# Patient Record
Sex: Female | Born: 1940 | Race: White | Hispanic: No | Smoking: Never smoker
Health system: Southern US, Community
[De-identification: ages and names within clinical notes are randomized; demographics above are authoritative.]

## PROBLEM LIST (undated history)

## (undated) DIAGNOSIS — M1990 Unspecified osteoarthritis, unspecified site: Secondary | ICD-10-CM

## (undated) DIAGNOSIS — F329 Major depressive disorder, single episode, unspecified: Secondary | ICD-10-CM

## (undated) DIAGNOSIS — K219 Gastro-esophageal reflux disease without esophagitis: Secondary | ICD-10-CM

## (undated) DIAGNOSIS — E785 Hyperlipidemia, unspecified: Secondary | ICD-10-CM

## (undated) DIAGNOSIS — R9431 Abnormal electrocardiogram [ECG] [EKG]: Secondary | ICD-10-CM

## (undated) DIAGNOSIS — F419 Anxiety disorder, unspecified: Secondary | ICD-10-CM

## (undated) DIAGNOSIS — I471 Supraventricular tachycardia: Secondary | ICD-10-CM

## (undated) DIAGNOSIS — E119 Type 2 diabetes mellitus without complications: Secondary | ICD-10-CM

## (undated) DIAGNOSIS — D649 Anemia, unspecified: Secondary | ICD-10-CM

## (undated) DIAGNOSIS — R55 Syncope and collapse: Secondary | ICD-10-CM

## (undated) DIAGNOSIS — I10 Essential (primary) hypertension: Secondary | ICD-10-CM

## (undated) HISTORY — PX: ABDOMINAL HYSTERECTOMY: SHX81

## (undated) HISTORY — PX: COLONOSCOPY: SHX174

## (undated) HISTORY — DX: Essential (primary) hypertension: I10

## (undated) HISTORY — DX: Anxiety disorder, unspecified: F41.9

## (undated) HISTORY — DX: Gastro-esophageal reflux disease without esophagitis: K21.9

## (undated) HISTORY — PX: CORNEAL TRANSPLANT: SHX108

## (undated) HISTORY — PX: CHOLECYSTECTOMY: SHX55

## (undated) HISTORY — DX: Unspecified osteoarthritis, unspecified site: M19.90

## (undated) HISTORY — DX: Syncope and collapse: R55

## (undated) HISTORY — DX: Anemia, unspecified: D64.9

## (undated) HISTORY — DX: Major depressive disorder, single episode, unspecified: F32.9

## (undated) HISTORY — DX: Hyperlipidemia, unspecified: E78.5

## (undated) HISTORY — PX: OTHER SURGICAL HISTORY: SHX169

## (undated) HISTORY — DX: Abnormal electrocardiogram (ECG) (EKG): R94.31

## (undated) HISTORY — DX: Supraventricular tachycardia: I47.1

## (undated) HISTORY — DX: Type 2 diabetes mellitus without complications: E11.9

---

## 2011-10-29 DIAGNOSIS — E785 Hyperlipidemia, unspecified: Secondary | ICD-10-CM | POA: Diagnosis not present

## 2011-10-29 DIAGNOSIS — Z79899 Other long term (current) drug therapy: Secondary | ICD-10-CM | POA: Diagnosis not present

## 2011-10-29 DIAGNOSIS — I10 Essential (primary) hypertension: Secondary | ICD-10-CM | POA: Diagnosis not present

## 2011-10-29 DIAGNOSIS — R7309 Other abnormal glucose: Secondary | ICD-10-CM | POA: Diagnosis not present

## 2011-10-29 DIAGNOSIS — Z6830 Body mass index (BMI) 30.0-30.9, adult: Secondary | ICD-10-CM | POA: Diagnosis not present

## 2011-10-29 DIAGNOSIS — M810 Age-related osteoporosis without current pathological fracture: Secondary | ICD-10-CM | POA: Diagnosis not present

## 2012-02-11 DIAGNOSIS — R7309 Other abnormal glucose: Secondary | ICD-10-CM | POA: Diagnosis not present

## 2012-02-11 DIAGNOSIS — Z1212 Encounter for screening for malignant neoplasm of rectum: Secondary | ICD-10-CM | POA: Diagnosis not present

## 2012-02-11 DIAGNOSIS — M810 Age-related osteoporosis without current pathological fracture: Secondary | ICD-10-CM | POA: Diagnosis not present

## 2012-02-11 DIAGNOSIS — Z6829 Body mass index (BMI) 29.0-29.9, adult: Secondary | ICD-10-CM | POA: Diagnosis not present

## 2012-02-11 DIAGNOSIS — E785 Hyperlipidemia, unspecified: Secondary | ICD-10-CM | POA: Diagnosis not present

## 2012-02-11 DIAGNOSIS — Z0000 Encounter for general adult medical examination without abnormal findings: Secondary | ICD-10-CM | POA: Diagnosis not present

## 2012-02-11 DIAGNOSIS — I10 Essential (primary) hypertension: Secondary | ICD-10-CM | POA: Diagnosis not present

## 2012-05-19 DIAGNOSIS — Z23 Encounter for immunization: Secondary | ICD-10-CM | POA: Diagnosis not present

## 2012-05-19 DIAGNOSIS — R7309 Other abnormal glucose: Secondary | ICD-10-CM | POA: Diagnosis not present

## 2012-05-19 DIAGNOSIS — E785 Hyperlipidemia, unspecified: Secondary | ICD-10-CM | POA: Diagnosis not present

## 2012-05-19 DIAGNOSIS — M810 Age-related osteoporosis without current pathological fracture: Secondary | ICD-10-CM | POA: Diagnosis not present

## 2012-05-19 DIAGNOSIS — I10 Essential (primary) hypertension: Secondary | ICD-10-CM | POA: Diagnosis not present

## 2012-05-19 DIAGNOSIS — K219 Gastro-esophageal reflux disease without esophagitis: Secondary | ICD-10-CM | POA: Diagnosis not present

## 2012-12-01 DIAGNOSIS — E785 Hyperlipidemia, unspecified: Secondary | ICD-10-CM | POA: Diagnosis not present

## 2012-12-01 DIAGNOSIS — I10 Essential (primary) hypertension: Secondary | ICD-10-CM | POA: Diagnosis not present

## 2012-12-01 DIAGNOSIS — Z1331 Encounter for screening for depression: Secondary | ICD-10-CM | POA: Diagnosis not present

## 2012-12-01 DIAGNOSIS — Z9181 History of falling: Secondary | ICD-10-CM | POA: Diagnosis not present

## 2012-12-01 DIAGNOSIS — R7309 Other abnormal glucose: Secondary | ICD-10-CM | POA: Diagnosis not present

## 2012-12-01 DIAGNOSIS — Z6828 Body mass index (BMI) 28.0-28.9, adult: Secondary | ICD-10-CM | POA: Diagnosis not present

## 2013-03-09 DIAGNOSIS — E785 Hyperlipidemia, unspecified: Secondary | ICD-10-CM | POA: Diagnosis not present

## 2013-03-09 DIAGNOSIS — Z6828 Body mass index (BMI) 28.0-28.9, adult: Secondary | ICD-10-CM | POA: Diagnosis not present

## 2013-03-09 DIAGNOSIS — Z0000 Encounter for general adult medical examination without abnormal findings: Secondary | ICD-10-CM | POA: Diagnosis not present

## 2013-03-09 DIAGNOSIS — Z79899 Other long term (current) drug therapy: Secondary | ICD-10-CM | POA: Diagnosis not present

## 2013-03-09 DIAGNOSIS — Z1212 Encounter for screening for malignant neoplasm of rectum: Secondary | ICD-10-CM | POA: Diagnosis not present

## 2013-03-09 DIAGNOSIS — E559 Vitamin D deficiency, unspecified: Secondary | ICD-10-CM | POA: Diagnosis not present

## 2013-03-09 DIAGNOSIS — R7309 Other abnormal glucose: Secondary | ICD-10-CM | POA: Diagnosis not present

## 2013-03-09 DIAGNOSIS — I10 Essential (primary) hypertension: Secondary | ICD-10-CM | POA: Diagnosis not present

## 2013-04-13 DIAGNOSIS — Z23 Encounter for immunization: Secondary | ICD-10-CM | POA: Diagnosis not present

## 2013-05-13 DIAGNOSIS — I10 Essential (primary) hypertension: Secondary | ICD-10-CM | POA: Diagnosis not present

## 2013-05-13 DIAGNOSIS — Z6828 Body mass index (BMI) 28.0-28.9, adult: Secondary | ICD-10-CM | POA: Diagnosis not present

## 2013-05-30 DIAGNOSIS — I10 Essential (primary) hypertension: Secondary | ICD-10-CM | POA: Diagnosis not present

## 2013-06-03 DIAGNOSIS — I10 Essential (primary) hypertension: Secondary | ICD-10-CM | POA: Diagnosis not present

## 2013-06-03 DIAGNOSIS — Z6827 Body mass index (BMI) 27.0-27.9, adult: Secondary | ICD-10-CM | POA: Diagnosis not present

## 2013-06-07 DIAGNOSIS — I10 Essential (primary) hypertension: Secondary | ICD-10-CM | POA: Diagnosis not present

## 2013-06-10 DIAGNOSIS — I10 Essential (primary) hypertension: Secondary | ICD-10-CM | POA: Diagnosis not present

## 2013-06-10 DIAGNOSIS — Z6827 Body mass index (BMI) 27.0-27.9, adult: Secondary | ICD-10-CM | POA: Diagnosis not present

## 2013-06-17 ENCOUNTER — Other Ambulatory Visit: Payer: Self-pay | Admitting: Internal Medicine

## 2013-06-17 DIAGNOSIS — I10 Essential (primary) hypertension: Secondary | ICD-10-CM | POA: Diagnosis not present

## 2013-06-17 DIAGNOSIS — Z6827 Body mass index (BMI) 27.0-27.9, adult: Secondary | ICD-10-CM | POA: Diagnosis not present

## 2013-06-21 ENCOUNTER — Ambulatory Visit
Admission: RE | Admit: 2013-06-21 | Discharge: 2013-06-21 | Disposition: A | Discharge status: Home / Self Care | Payer: BC Managed Care – PPO | Source: Ambulatory Visit | Attending: Internal Medicine | Admitting: Internal Medicine

## 2013-06-21 DIAGNOSIS — I10 Essential (primary) hypertension: Secondary | ICD-10-CM

## 2013-06-24 DIAGNOSIS — R9389 Abnormal findings on diagnostic imaging of other specified body structures: Secondary | ICD-10-CM | POA: Diagnosis not present

## 2013-06-24 DIAGNOSIS — I701 Atherosclerosis of renal artery: Secondary | ICD-10-CM | POA: Diagnosis not present

## 2013-06-24 DIAGNOSIS — I10 Essential (primary) hypertension: Secondary | ICD-10-CM | POA: Diagnosis not present

## 2013-06-29 DIAGNOSIS — R9389 Abnormal findings on diagnostic imaging of other specified body structures: Secondary | ICD-10-CM | POA: Diagnosis not present

## 2013-06-29 DIAGNOSIS — I701 Atherosclerosis of renal artery: Secondary | ICD-10-CM | POA: Diagnosis not present

## 2013-06-29 DIAGNOSIS — K219 Gastro-esophageal reflux disease without esophagitis: Secondary | ICD-10-CM | POA: Diagnosis not present

## 2013-06-29 DIAGNOSIS — E7800 Pure hypercholesterolemia, unspecified: Secondary | ICD-10-CM | POA: Diagnosis not present

## 2013-06-29 DIAGNOSIS — G4730 Sleep apnea, unspecified: Secondary | ICD-10-CM | POA: Diagnosis not present

## 2013-06-29 DIAGNOSIS — I10 Essential (primary) hypertension: Secondary | ICD-10-CM | POA: Diagnosis not present

## 2013-07-08 DIAGNOSIS — I119 Hypertensive heart disease without heart failure: Secondary | ICD-10-CM | POA: Diagnosis not present

## 2013-07-11 DIAGNOSIS — I10 Essential (primary) hypertension: Secondary | ICD-10-CM | POA: Diagnosis not present

## 2013-07-18 DIAGNOSIS — Z6827 Body mass index (BMI) 27.0-27.9, adult: Secondary | ICD-10-CM | POA: Diagnosis not present

## 2013-07-18 DIAGNOSIS — I10 Essential (primary) hypertension: Secondary | ICD-10-CM | POA: Diagnosis not present

## 2013-07-21 DIAGNOSIS — I10 Essential (primary) hypertension: Secondary | ICD-10-CM | POA: Diagnosis not present

## 2013-07-21 DIAGNOSIS — E7800 Pure hypercholesterolemia, unspecified: Secondary | ICD-10-CM | POA: Diagnosis not present

## 2013-07-21 DIAGNOSIS — I701 Atherosclerosis of renal artery: Secondary | ICD-10-CM | POA: Diagnosis not present

## 2013-07-26 DIAGNOSIS — G4710 Hypersomnia, unspecified: Secondary | ICD-10-CM | POA: Diagnosis not present

## 2013-07-26 DIAGNOSIS — G4730 Sleep apnea, unspecified: Secondary | ICD-10-CM | POA: Diagnosis not present

## 2013-07-28 DIAGNOSIS — I10 Essential (primary) hypertension: Secondary | ICD-10-CM | POA: Diagnosis not present

## 2013-07-28 DIAGNOSIS — I701 Atherosclerosis of renal artery: Secondary | ICD-10-CM | POA: Diagnosis not present

## 2013-07-28 DIAGNOSIS — Z6828 Body mass index (BMI) 28.0-28.9, adult: Secondary | ICD-10-CM | POA: Diagnosis not present

## 2013-08-24 DIAGNOSIS — R5381 Other malaise: Secondary | ICD-10-CM | POA: Diagnosis not present

## 2013-08-24 DIAGNOSIS — G4730 Sleep apnea, unspecified: Secondary | ICD-10-CM | POA: Diagnosis not present

## 2013-08-24 DIAGNOSIS — R5383 Other fatigue: Secondary | ICD-10-CM | POA: Diagnosis not present

## 2013-08-24 DIAGNOSIS — G4710 Hypersomnia, unspecified: Secondary | ICD-10-CM | POA: Diagnosis not present

## 2013-08-24 DIAGNOSIS — R062 Wheezing: Secondary | ICD-10-CM | POA: Diagnosis not present

## 2013-08-31 DIAGNOSIS — Z6827 Body mass index (BMI) 27.0-27.9, adult: Secondary | ICD-10-CM | POA: Diagnosis not present

## 2013-08-31 DIAGNOSIS — E785 Hyperlipidemia, unspecified: Secondary | ICD-10-CM | POA: Diagnosis not present

## 2013-08-31 DIAGNOSIS — R7309 Other abnormal glucose: Secondary | ICD-10-CM | POA: Diagnosis not present

## 2013-08-31 DIAGNOSIS — I10 Essential (primary) hypertension: Secondary | ICD-10-CM | POA: Diagnosis not present

## 2013-09-19 DIAGNOSIS — R5381 Other malaise: Secondary | ICD-10-CM | POA: Diagnosis not present

## 2013-09-19 DIAGNOSIS — G4710 Hypersomnia, unspecified: Secondary | ICD-10-CM | POA: Diagnosis not present

## 2013-09-19 DIAGNOSIS — R062 Wheezing: Secondary | ICD-10-CM | POA: Diagnosis not present

## 2013-09-19 DIAGNOSIS — G4730 Sleep apnea, unspecified: Secondary | ICD-10-CM | POA: Diagnosis not present

## 2013-10-05 DIAGNOSIS — Z6827 Body mass index (BMI) 27.0-27.9, adult: Secondary | ICD-10-CM | POA: Diagnosis not present

## 2013-10-05 DIAGNOSIS — I10 Essential (primary) hypertension: Secondary | ICD-10-CM | POA: Diagnosis not present

## 2013-10-26 DIAGNOSIS — G4710 Hypersomnia, unspecified: Secondary | ICD-10-CM | POA: Diagnosis not present

## 2013-10-26 DIAGNOSIS — R5383 Other fatigue: Secondary | ICD-10-CM | POA: Diagnosis not present

## 2013-10-26 DIAGNOSIS — G4730 Sleep apnea, unspecified: Secondary | ICD-10-CM | POA: Diagnosis not present

## 2013-10-26 DIAGNOSIS — R5381 Other malaise: Secondary | ICD-10-CM | POA: Diagnosis not present

## 2013-10-30 DIAGNOSIS — G4710 Hypersomnia, unspecified: Secondary | ICD-10-CM | POA: Diagnosis not present

## 2013-11-02 DIAGNOSIS — G4730 Sleep apnea, unspecified: Secondary | ICD-10-CM | POA: Diagnosis not present

## 2013-11-02 DIAGNOSIS — I701 Atherosclerosis of renal artery: Secondary | ICD-10-CM | POA: Diagnosis not present

## 2013-11-02 DIAGNOSIS — I10 Essential (primary) hypertension: Secondary | ICD-10-CM | POA: Diagnosis not present

## 2013-11-02 DIAGNOSIS — E7800 Pure hypercholesterolemia, unspecified: Secondary | ICD-10-CM | POA: Diagnosis not present

## 2013-11-15 DIAGNOSIS — G4730 Sleep apnea, unspecified: Secondary | ICD-10-CM | POA: Diagnosis not present

## 2013-11-15 DIAGNOSIS — I10 Essential (primary) hypertension: Secondary | ICD-10-CM | POA: Diagnosis not present

## 2013-11-15 DIAGNOSIS — IMO0002 Reserved for concepts with insufficient information to code with codable children: Secondary | ICD-10-CM | POA: Diagnosis not present

## 2013-11-15 DIAGNOSIS — G4710 Hypersomnia, unspecified: Secondary | ICD-10-CM | POA: Diagnosis not present

## 2013-11-15 DIAGNOSIS — Z6827 Body mass index (BMI) 27.0-27.9, adult: Secondary | ICD-10-CM | POA: Diagnosis not present

## 2013-11-30 DIAGNOSIS — Z6827 Body mass index (BMI) 27.0-27.9, adult: Secondary | ICD-10-CM | POA: Diagnosis not present

## 2013-11-30 DIAGNOSIS — R7309 Other abnormal glucose: Secondary | ICD-10-CM | POA: Diagnosis not present

## 2013-11-30 DIAGNOSIS — E785 Hyperlipidemia, unspecified: Secondary | ICD-10-CM | POA: Diagnosis not present

## 2013-11-30 DIAGNOSIS — I10 Essential (primary) hypertension: Secondary | ICD-10-CM | POA: Diagnosis not present

## 2013-11-30 DIAGNOSIS — IMO0002 Reserved for concepts with insufficient information to code with codable children: Secondary | ICD-10-CM | POA: Diagnosis not present

## 2013-12-01 DIAGNOSIS — IMO0001 Reserved for inherently not codable concepts without codable children: Secondary | ICD-10-CM | POA: Diagnosis not present

## 2013-12-01 DIAGNOSIS — M542 Cervicalgia: Secondary | ICD-10-CM | POA: Diagnosis not present

## 2013-12-01 DIAGNOSIS — M6281 Muscle weakness (generalized): Secondary | ICD-10-CM | POA: Diagnosis not present

## 2013-12-01 DIAGNOSIS — IMO0002 Reserved for concepts with insufficient information to code with codable children: Secondary | ICD-10-CM | POA: Diagnosis not present

## 2013-12-01 DIAGNOSIS — M25519 Pain in unspecified shoulder: Secondary | ICD-10-CM | POA: Diagnosis not present

## 2013-12-01 DIAGNOSIS — R293 Abnormal posture: Secondary | ICD-10-CM | POA: Diagnosis not present

## 2013-12-07 DIAGNOSIS — M6281 Muscle weakness (generalized): Secondary | ICD-10-CM | POA: Diagnosis not present

## 2013-12-07 DIAGNOSIS — M542 Cervicalgia: Secondary | ICD-10-CM | POA: Diagnosis not present

## 2013-12-07 DIAGNOSIS — IMO0001 Reserved for inherently not codable concepts without codable children: Secondary | ICD-10-CM | POA: Diagnosis not present

## 2013-12-07 DIAGNOSIS — M25519 Pain in unspecified shoulder: Secondary | ICD-10-CM | POA: Diagnosis not present

## 2013-12-07 DIAGNOSIS — R293 Abnormal posture: Secondary | ICD-10-CM | POA: Diagnosis not present

## 2013-12-07 DIAGNOSIS — IMO0002 Reserved for concepts with insufficient information to code with codable children: Secondary | ICD-10-CM | POA: Diagnosis not present

## 2013-12-09 DIAGNOSIS — IMO0001 Reserved for inherently not codable concepts without codable children: Secondary | ICD-10-CM | POA: Diagnosis not present

## 2013-12-09 DIAGNOSIS — R293 Abnormal posture: Secondary | ICD-10-CM | POA: Diagnosis not present

## 2013-12-09 DIAGNOSIS — M6281 Muscle weakness (generalized): Secondary | ICD-10-CM | POA: Diagnosis not present

## 2013-12-09 DIAGNOSIS — IMO0002 Reserved for concepts with insufficient information to code with codable children: Secondary | ICD-10-CM | POA: Diagnosis not present

## 2013-12-09 DIAGNOSIS — M25519 Pain in unspecified shoulder: Secondary | ICD-10-CM | POA: Diagnosis not present

## 2013-12-09 DIAGNOSIS — M542 Cervicalgia: Secondary | ICD-10-CM | POA: Diagnosis not present

## 2013-12-14 DIAGNOSIS — IMO0002 Reserved for concepts with insufficient information to code with codable children: Secondary | ICD-10-CM | POA: Diagnosis not present

## 2013-12-14 DIAGNOSIS — M25519 Pain in unspecified shoulder: Secondary | ICD-10-CM | POA: Diagnosis not present

## 2013-12-14 DIAGNOSIS — IMO0001 Reserved for inherently not codable concepts without codable children: Secondary | ICD-10-CM | POA: Diagnosis not present

## 2013-12-14 DIAGNOSIS — M6281 Muscle weakness (generalized): Secondary | ICD-10-CM | POA: Diagnosis not present

## 2013-12-14 DIAGNOSIS — M542 Cervicalgia: Secondary | ICD-10-CM | POA: Diagnosis not present

## 2013-12-14 DIAGNOSIS — R293 Abnormal posture: Secondary | ICD-10-CM | POA: Diagnosis not present

## 2013-12-28 DIAGNOSIS — IMO0002 Reserved for concepts with insufficient information to code with codable children: Secondary | ICD-10-CM | POA: Diagnosis not present

## 2013-12-28 DIAGNOSIS — IMO0001 Reserved for inherently not codable concepts without codable children: Secondary | ICD-10-CM | POA: Diagnosis not present

## 2013-12-28 DIAGNOSIS — R293 Abnormal posture: Secondary | ICD-10-CM | POA: Diagnosis not present

## 2013-12-28 DIAGNOSIS — M6281 Muscle weakness (generalized): Secondary | ICD-10-CM | POA: Diagnosis not present

## 2013-12-28 DIAGNOSIS — M25519 Pain in unspecified shoulder: Secondary | ICD-10-CM | POA: Diagnosis not present

## 2013-12-28 DIAGNOSIS — M542 Cervicalgia: Secondary | ICD-10-CM | POA: Diagnosis not present

## 2014-01-11 DIAGNOSIS — G4730 Sleep apnea, unspecified: Secondary | ICD-10-CM | POA: Diagnosis not present

## 2014-01-11 DIAGNOSIS — I701 Atherosclerosis of renal artery: Secondary | ICD-10-CM | POA: Diagnosis not present

## 2014-01-11 DIAGNOSIS — I10 Essential (primary) hypertension: Secondary | ICD-10-CM | POA: Diagnosis not present

## 2014-01-11 DIAGNOSIS — K219 Gastro-esophageal reflux disease without esophagitis: Secondary | ICD-10-CM | POA: Diagnosis not present

## 2014-01-11 DIAGNOSIS — E7800 Pure hypercholesterolemia, unspecified: Secondary | ICD-10-CM | POA: Diagnosis not present

## 2014-01-25 DIAGNOSIS — G4710 Hypersomnia, unspecified: Secondary | ICD-10-CM | POA: Diagnosis not present

## 2014-01-25 DIAGNOSIS — G4730 Sleep apnea, unspecified: Secondary | ICD-10-CM | POA: Diagnosis not present

## 2014-01-25 DIAGNOSIS — R5381 Other malaise: Secondary | ICD-10-CM | POA: Diagnosis not present

## 2014-01-25 DIAGNOSIS — R062 Wheezing: Secondary | ICD-10-CM | POA: Diagnosis not present

## 2014-01-25 DIAGNOSIS — R5383 Other fatigue: Secondary | ICD-10-CM | POA: Diagnosis not present

## 2014-03-08 DIAGNOSIS — R7309 Other abnormal glucose: Secondary | ICD-10-CM | POA: Diagnosis not present

## 2014-03-08 DIAGNOSIS — E785 Hyperlipidemia, unspecified: Secondary | ICD-10-CM | POA: Diagnosis not present

## 2014-03-08 DIAGNOSIS — Z1331 Encounter for screening for depression: Secondary | ICD-10-CM | POA: Diagnosis not present

## 2014-03-08 DIAGNOSIS — Z9181 History of falling: Secondary | ICD-10-CM | POA: Diagnosis not present

## 2014-03-08 DIAGNOSIS — I10 Essential (primary) hypertension: Secondary | ICD-10-CM | POA: Diagnosis not present

## 2014-03-08 DIAGNOSIS — Z6827 Body mass index (BMI) 27.0-27.9, adult: Secondary | ICD-10-CM | POA: Diagnosis not present

## 2014-04-12 DIAGNOSIS — M85852 Other specified disorders of bone density and structure, left thigh: Secondary | ICD-10-CM | POA: Diagnosis not present

## 2014-04-12 DIAGNOSIS — Z23 Encounter for immunization: Secondary | ICD-10-CM | POA: Diagnosis not present

## 2014-04-12 DIAGNOSIS — Z13820 Encounter for screening for osteoporosis: Secondary | ICD-10-CM | POA: Diagnosis not present

## 2014-04-12 DIAGNOSIS — Z1231 Encounter for screening mammogram for malignant neoplasm of breast: Secondary | ICD-10-CM | POA: Diagnosis not present

## 2014-04-19 DIAGNOSIS — G4730 Sleep apnea, unspecified: Secondary | ICD-10-CM | POA: Diagnosis not present

## 2014-04-19 DIAGNOSIS — I701 Atherosclerosis of renal artery: Secondary | ICD-10-CM | POA: Diagnosis not present

## 2014-04-19 DIAGNOSIS — K219 Gastro-esophageal reflux disease without esophagitis: Secondary | ICD-10-CM | POA: Diagnosis not present

## 2014-04-19 DIAGNOSIS — E780 Pure hypercholesterolemia: Secondary | ICD-10-CM | POA: Diagnosis not present

## 2014-04-19 DIAGNOSIS — I10 Essential (primary) hypertension: Secondary | ICD-10-CM | POA: Diagnosis not present

## 2014-04-26 DIAGNOSIS — G4730 Sleep apnea, unspecified: Secondary | ICD-10-CM | POA: Diagnosis not present

## 2014-05-03 DIAGNOSIS — E780 Pure hypercholesterolemia: Secondary | ICD-10-CM | POA: Diagnosis not present

## 2014-05-03 DIAGNOSIS — G4730 Sleep apnea, unspecified: Secondary | ICD-10-CM | POA: Diagnosis not present

## 2014-05-03 DIAGNOSIS — I10 Essential (primary) hypertension: Secondary | ICD-10-CM | POA: Diagnosis not present

## 2014-05-04 DIAGNOSIS — I10 Essential (primary) hypertension: Secondary | ICD-10-CM | POA: Diagnosis not present

## 2014-05-04 DIAGNOSIS — I701 Atherosclerosis of renal artery: Secondary | ICD-10-CM | POA: Diagnosis not present

## 2014-05-04 DIAGNOSIS — G4730 Sleep apnea, unspecified: Secondary | ICD-10-CM | POA: Diagnosis not present

## 2014-05-04 DIAGNOSIS — E780 Pure hypercholesterolemia: Secondary | ICD-10-CM | POA: Diagnosis not present

## 2014-05-04 DIAGNOSIS — K219 Gastro-esophageal reflux disease without esophagitis: Secondary | ICD-10-CM | POA: Diagnosis not present

## 2014-05-16 DIAGNOSIS — E780 Pure hypercholesterolemia: Secondary | ICD-10-CM | POA: Diagnosis not present

## 2014-05-31 DIAGNOSIS — I10 Essential (primary) hypertension: Secondary | ICD-10-CM | POA: Diagnosis not present

## 2014-06-14 DIAGNOSIS — I10 Essential (primary) hypertension: Secondary | ICD-10-CM | POA: Diagnosis not present

## 2014-06-14 DIAGNOSIS — Z6827 Body mass index (BMI) 27.0-27.9, adult: Secondary | ICD-10-CM | POA: Diagnosis not present

## 2014-06-14 DIAGNOSIS — E785 Hyperlipidemia, unspecified: Secondary | ICD-10-CM | POA: Diagnosis not present

## 2014-06-14 DIAGNOSIS — F329 Major depressive disorder, single episode, unspecified: Secondary | ICD-10-CM | POA: Diagnosis not present

## 2014-06-14 DIAGNOSIS — R7309 Other abnormal glucose: Secondary | ICD-10-CM | POA: Diagnosis not present

## 2014-12-27 DIAGNOSIS — I10 Essential (primary) hypertension: Secondary | ICD-10-CM | POA: Diagnosis not present

## 2014-12-27 DIAGNOSIS — Z6827 Body mass index (BMI) 27.0-27.9, adult: Secondary | ICD-10-CM | POA: Diagnosis not present

## 2014-12-27 DIAGNOSIS — R7309 Other abnormal glucose: Secondary | ICD-10-CM | POA: Diagnosis not present

## 2014-12-27 DIAGNOSIS — E782 Mixed hyperlipidemia: Secondary | ICD-10-CM | POA: Diagnosis not present

## 2014-12-27 DIAGNOSIS — F411 Generalized anxiety disorder: Secondary | ICD-10-CM | POA: Diagnosis not present

## 2015-01-30 DIAGNOSIS — M79674 Pain in right toe(s): Secondary | ICD-10-CM | POA: Diagnosis not present

## 2015-01-30 DIAGNOSIS — Z6827 Body mass index (BMI) 27.0-27.9, adult: Secondary | ICD-10-CM | POA: Diagnosis not present

## 2015-04-04 DIAGNOSIS — Z79899 Other long term (current) drug therapy: Secondary | ICD-10-CM | POA: Diagnosis not present

## 2015-04-04 DIAGNOSIS — Z1231 Encounter for screening mammogram for malignant neoplasm of breast: Secondary | ICD-10-CM | POA: Diagnosis not present

## 2015-04-04 DIAGNOSIS — I10 Essential (primary) hypertension: Secondary | ICD-10-CM | POA: Diagnosis not present

## 2015-04-04 DIAGNOSIS — Z6826 Body mass index (BMI) 26.0-26.9, adult: Secondary | ICD-10-CM | POA: Diagnosis not present

## 2015-04-04 DIAGNOSIS — R7303 Prediabetes: Secondary | ICD-10-CM | POA: Diagnosis not present

## 2015-04-04 DIAGNOSIS — E785 Hyperlipidemia, unspecified: Secondary | ICD-10-CM | POA: Diagnosis not present

## 2015-04-04 DIAGNOSIS — Z23 Encounter for immunization: Secondary | ICD-10-CM | POA: Diagnosis not present

## 2015-04-04 DIAGNOSIS — F411 Generalized anxiety disorder: Secondary | ICD-10-CM | POA: Diagnosis not present

## 2015-04-04 DIAGNOSIS — Z9181 History of falling: Secondary | ICD-10-CM | POA: Diagnosis not present

## 2015-05-14 DIAGNOSIS — Z6827 Body mass index (BMI) 27.0-27.9, adult: Secondary | ICD-10-CM | POA: Diagnosis not present

## 2015-05-14 DIAGNOSIS — H8113 Benign paroxysmal vertigo, bilateral: Secondary | ICD-10-CM | POA: Diagnosis not present

## 2015-05-14 DIAGNOSIS — I10 Essential (primary) hypertension: Secondary | ICD-10-CM | POA: Diagnosis not present

## 2015-05-17 DIAGNOSIS — R42 Dizziness and giddiness: Secondary | ICD-10-CM | POA: Diagnosis not present

## 2015-05-17 DIAGNOSIS — Z6826 Body mass index (BMI) 26.0-26.9, adult: Secondary | ICD-10-CM | POA: Diagnosis not present

## 2015-05-17 DIAGNOSIS — H8113 Benign paroxysmal vertigo, bilateral: Secondary | ICD-10-CM | POA: Diagnosis not present

## 2015-06-05 DIAGNOSIS — Z6826 Body mass index (BMI) 26.0-26.9, adult: Secondary | ICD-10-CM | POA: Diagnosis not present

## 2015-06-05 DIAGNOSIS — J208 Acute bronchitis due to other specified organisms: Secondary | ICD-10-CM | POA: Diagnosis not present

## 2015-06-05 DIAGNOSIS — I10 Essential (primary) hypertension: Secondary | ICD-10-CM | POA: Diagnosis not present

## 2015-10-17 DIAGNOSIS — G4733 Obstructive sleep apnea (adult) (pediatric): Secondary | ICD-10-CM | POA: Diagnosis not present

## 2015-10-17 DIAGNOSIS — Z6826 Body mass index (BMI) 26.0-26.9, adult: Secondary | ICD-10-CM | POA: Diagnosis not present

## 2015-10-17 DIAGNOSIS — Z1389 Encounter for screening for other disorder: Secondary | ICD-10-CM | POA: Diagnosis not present

## 2015-10-17 DIAGNOSIS — R7309 Other abnormal glucose: Secondary | ICD-10-CM | POA: Diagnosis not present

## 2015-10-17 DIAGNOSIS — E785 Hyperlipidemia, unspecified: Secondary | ICD-10-CM | POA: Diagnosis not present

## 2015-10-17 DIAGNOSIS — F411 Generalized anxiety disorder: Secondary | ICD-10-CM | POA: Diagnosis not present

## 2015-10-17 DIAGNOSIS — I10 Essential (primary) hypertension: Secondary | ICD-10-CM | POA: Diagnosis not present

## 2015-10-28 DIAGNOSIS — M5432 Sciatica, left side: Secondary | ICD-10-CM | POA: Diagnosis not present

## 2015-11-06 DIAGNOSIS — M5432 Sciatica, left side: Secondary | ICD-10-CM | POA: Diagnosis not present

## 2015-11-06 DIAGNOSIS — M25552 Pain in left hip: Secondary | ICD-10-CM | POA: Diagnosis not present

## 2015-11-06 DIAGNOSIS — M47816 Spondylosis without myelopathy or radiculopathy, lumbar region: Secondary | ICD-10-CM | POA: Diagnosis not present

## 2015-11-06 DIAGNOSIS — Z6827 Body mass index (BMI) 27.0-27.9, adult: Secondary | ICD-10-CM | POA: Diagnosis not present

## 2015-11-06 DIAGNOSIS — M160 Bilateral primary osteoarthritis of hip: Secondary | ICD-10-CM | POA: Diagnosis not present

## 2015-11-14 DIAGNOSIS — M5432 Sciatica, left side: Secondary | ICD-10-CM | POA: Diagnosis not present

## 2015-11-19 DIAGNOSIS — E663 Overweight: Secondary | ICD-10-CM | POA: Diagnosis not present

## 2015-11-19 DIAGNOSIS — Z6827 Body mass index (BMI) 27.0-27.9, adult: Secondary | ICD-10-CM | POA: Diagnosis not present

## 2015-11-23 DIAGNOSIS — H66009 Acute suppurative otitis media without spontaneous rupture of ear drum, unspecified ear: Secondary | ICD-10-CM | POA: Diagnosis not present

## 2015-12-06 DIAGNOSIS — M5442 Lumbago with sciatica, left side: Secondary | ICD-10-CM | POA: Diagnosis not present

## 2015-12-06 DIAGNOSIS — Z6827 Body mass index (BMI) 27.0-27.9, adult: Secondary | ICD-10-CM | POA: Diagnosis not present

## 2015-12-13 DIAGNOSIS — M5136 Other intervertebral disc degeneration, lumbar region: Secondary | ICD-10-CM | POA: Diagnosis not present

## 2015-12-13 DIAGNOSIS — Z6827 Body mass index (BMI) 27.0-27.9, adult: Secondary | ICD-10-CM | POA: Diagnosis not present

## 2015-12-13 DIAGNOSIS — I10 Essential (primary) hypertension: Secondary | ICD-10-CM | POA: Diagnosis not present

## 2015-12-13 DIAGNOSIS — M549 Dorsalgia, unspecified: Secondary | ICD-10-CM | POA: Diagnosis not present

## 2015-12-17 ENCOUNTER — Other Ambulatory Visit: Payer: Self-pay | Admitting: Neurosurgery

## 2015-12-17 DIAGNOSIS — M5136 Other intervertebral disc degeneration, lumbar region: Secondary | ICD-10-CM

## 2015-12-21 ENCOUNTER — Ambulatory Visit
Admission: RE | Admit: 2015-12-21 | Discharge: 2015-12-21 | Disposition: A | Discharge status: Home / Self Care | Payer: BLUE CROSS/BLUE SHIELD | Source: Ambulatory Visit | Attending: Neurosurgery | Admitting: Neurosurgery

## 2015-12-21 DIAGNOSIS — M47817 Spondylosis without myelopathy or radiculopathy, lumbosacral region: Secondary | ICD-10-CM | POA: Diagnosis not present

## 2015-12-21 DIAGNOSIS — M5136 Other intervertebral disc degeneration, lumbar region: Secondary | ICD-10-CM | POA: Diagnosis not present

## 2015-12-21 MED ORDER — IOPAMIDOL (ISOVUE-M 200) INJECTION 41%
1.0000 mL | Freq: Once | INTRAMUSCULAR | Status: AC
  Administered 2015-12-21: 1 mL via EPIDURAL

## 2015-12-21 MED ORDER — METHYLPREDNISOLONE ACETATE 40 MG/ML INJ SUSP (RADIOLOG
120.0000 mg | Freq: Once | INTRAMUSCULAR | Status: AC
  Administered 2015-12-21: 120 mg via EPIDURAL

## 2015-12-21 NOTE — Discharge Instructions (Signed)

## 2015-12-27 DIAGNOSIS — M4806 Spinal stenosis, lumbar region: Secondary | ICD-10-CM | POA: Diagnosis not present

## 2015-12-27 DIAGNOSIS — M5442 Lumbago with sciatica, left side: Secondary | ICD-10-CM | POA: Diagnosis not present

## 2015-12-27 DIAGNOSIS — Z6827 Body mass index (BMI) 27.0-27.9, adult: Secondary | ICD-10-CM | POA: Diagnosis not present

## 2016-01-10 ENCOUNTER — Other Ambulatory Visit: Payer: Self-pay | Admitting: Neurosurgery

## 2016-01-10 DIAGNOSIS — M5136 Other intervertebral disc degeneration, lumbar region: Secondary | ICD-10-CM

## 2016-01-15 ENCOUNTER — Ambulatory Visit
Admission: RE | Admit: 2016-01-15 | Discharge: 2016-01-15 | Disposition: A | Discharge status: Home / Self Care | Payer: BLUE CROSS/BLUE SHIELD | Source: Ambulatory Visit | Attending: Neurosurgery | Admitting: Neurosurgery

## 2016-01-15 DIAGNOSIS — M5136 Other intervertebral disc degeneration, lumbar region: Secondary | ICD-10-CM

## 2016-01-15 DIAGNOSIS — M51369 Other intervertebral disc degeneration, lumbar region without mention of lumbar back pain or lower extremity pain: Secondary | ICD-10-CM

## 2016-01-15 DIAGNOSIS — M545 Low back pain: Secondary | ICD-10-CM | POA: Diagnosis not present

## 2016-01-15 MED ORDER — IOPAMIDOL (ISOVUE-M 200) INJECTION 41%
1.0000 mL | Freq: Once | INTRAMUSCULAR | Status: AC
  Administered 2016-01-15: 1 mL via EPIDURAL

## 2016-01-15 MED ORDER — METHYLPREDNISOLONE ACETATE 40 MG/ML INJ SUSP (RADIOLOG
120.0000 mg | Freq: Once | INTRAMUSCULAR | Status: AC
  Administered 2016-01-15: 120 mg via EPIDURAL

## 2016-01-15 NOTE — Discharge Instructions (Signed)

## 2016-01-23 DIAGNOSIS — R7309 Other abnormal glucose: Secondary | ICD-10-CM | POA: Diagnosis not present

## 2016-01-23 DIAGNOSIS — E785 Hyperlipidemia, unspecified: Secondary | ICD-10-CM | POA: Diagnosis not present

## 2016-01-23 DIAGNOSIS — G4733 Obstructive sleep apnea (adult) (pediatric): Secondary | ICD-10-CM | POA: Diagnosis not present

## 2016-01-23 DIAGNOSIS — I10 Essential (primary) hypertension: Secondary | ICD-10-CM | POA: Diagnosis not present

## 2016-01-23 DIAGNOSIS — M4806 Spinal stenosis, lumbar region: Secondary | ICD-10-CM | POA: Diagnosis not present

## 2016-01-23 DIAGNOSIS — F411 Generalized anxiety disorder: Secondary | ICD-10-CM | POA: Diagnosis not present

## 2016-04-29 DIAGNOSIS — I10 Essential (primary) hypertension: Secondary | ICD-10-CM | POA: Diagnosis not present

## 2016-04-29 DIAGNOSIS — Z1231 Encounter for screening mammogram for malignant neoplasm of breast: Secondary | ICD-10-CM | POA: Diagnosis not present

## 2016-04-29 DIAGNOSIS — E785 Hyperlipidemia, unspecified: Secondary | ICD-10-CM | POA: Diagnosis not present

## 2016-04-29 DIAGNOSIS — E559 Vitamin D deficiency, unspecified: Secondary | ICD-10-CM | POA: Diagnosis not present

## 2016-04-29 DIAGNOSIS — Z79899 Other long term (current) drug therapy: Secondary | ICD-10-CM | POA: Diagnosis not present

## 2016-04-29 DIAGNOSIS — Z9181 History of falling: Secondary | ICD-10-CM | POA: Diagnosis not present

## 2016-04-29 DIAGNOSIS — M8589 Other specified disorders of bone density and structure, multiple sites: Secondary | ICD-10-CM | POA: Diagnosis not present

## 2016-04-29 DIAGNOSIS — R7309 Other abnormal glucose: Secondary | ICD-10-CM | POA: Diagnosis not present

## 2016-07-22 DIAGNOSIS — J208 Acute bronchitis due to other specified organisms: Secondary | ICD-10-CM | POA: Diagnosis not present

## 2016-07-30 DIAGNOSIS — J309 Allergic rhinitis, unspecified: Secondary | ICD-10-CM | POA: Diagnosis not present

## 2016-07-30 DIAGNOSIS — G4733 Obstructive sleep apnea (adult) (pediatric): Secondary | ICD-10-CM | POA: Diagnosis not present

## 2016-07-30 DIAGNOSIS — I10 Essential (primary) hypertension: Secondary | ICD-10-CM | POA: Diagnosis not present

## 2016-07-30 DIAGNOSIS — F200 Paranoid schizophrenia: Secondary | ICD-10-CM | POA: Diagnosis not present

## 2016-07-30 DIAGNOSIS — F411 Generalized anxiety disorder: Secondary | ICD-10-CM | POA: Diagnosis not present

## 2016-07-30 DIAGNOSIS — E785 Hyperlipidemia, unspecified: Secondary | ICD-10-CM | POA: Diagnosis not present

## 2016-07-30 DIAGNOSIS — R7309 Other abnormal glucose: Secondary | ICD-10-CM | POA: Diagnosis not present

## 2016-07-30 DIAGNOSIS — E663 Overweight: Secondary | ICD-10-CM | POA: Diagnosis not present

## 2016-08-05 DIAGNOSIS — J111 Influenza due to unidentified influenza virus with other respiratory manifestations: Secondary | ICD-10-CM | POA: Diagnosis not present

## 2016-08-05 DIAGNOSIS — J208 Acute bronchitis due to other specified organisms: Secondary | ICD-10-CM | POA: Diagnosis not present

## 2016-09-19 DIAGNOSIS — H2703 Aphakia, bilateral: Secondary | ICD-10-CM | POA: Diagnosis not present

## 2016-10-22 DIAGNOSIS — Z1231 Encounter for screening mammogram for malignant neoplasm of breast: Secondary | ICD-10-CM | POA: Diagnosis not present

## 2016-10-22 DIAGNOSIS — M8588 Other specified disorders of bone density and structure, other site: Secondary | ICD-10-CM | POA: Diagnosis not present

## 2016-10-22 DIAGNOSIS — M8589 Other specified disorders of bone density and structure, multiple sites: Secondary | ICD-10-CM | POA: Diagnosis not present

## 2016-10-29 DIAGNOSIS — E785 Hyperlipidemia, unspecified: Secondary | ICD-10-CM | POA: Diagnosis not present

## 2016-10-29 DIAGNOSIS — I10 Essential (primary) hypertension: Secondary | ICD-10-CM | POA: Diagnosis not present

## 2016-10-29 DIAGNOSIS — R7309 Other abnormal glucose: Secondary | ICD-10-CM | POA: Diagnosis not present

## 2016-10-29 DIAGNOSIS — F411 Generalized anxiety disorder: Secondary | ICD-10-CM | POA: Diagnosis not present

## 2016-10-29 DIAGNOSIS — F200 Paranoid schizophrenia: Secondary | ICD-10-CM | POA: Diagnosis not present

## 2016-11-25 DIAGNOSIS — E871 Hypo-osmolality and hyponatremia: Secondary | ICD-10-CM | POA: Diagnosis not present

## 2016-12-09 DIAGNOSIS — J309 Allergic rhinitis, unspecified: Secondary | ICD-10-CM | POA: Diagnosis not present

## 2016-12-09 DIAGNOSIS — J069 Acute upper respiratory infection, unspecified: Secondary | ICD-10-CM | POA: Diagnosis not present

## 2017-01-07 DIAGNOSIS — Z6825 Body mass index (BMI) 25.0-25.9, adult: Secondary | ICD-10-CM | POA: Diagnosis not present

## 2017-01-07 DIAGNOSIS — I10 Essential (primary) hypertension: Secondary | ICD-10-CM | POA: Diagnosis not present

## 2017-01-19 DIAGNOSIS — R1110 Vomiting, unspecified: Secondary | ICD-10-CM | POA: Diagnosis not present

## 2017-01-19 DIAGNOSIS — F411 Generalized anxiety disorder: Secondary | ICD-10-CM | POA: Diagnosis not present

## 2017-01-19 DIAGNOSIS — I10 Essential (primary) hypertension: Secondary | ICD-10-CM | POA: Diagnosis not present

## 2017-01-19 DIAGNOSIS — R112 Nausea with vomiting, unspecified: Secondary | ICD-10-CM | POA: Diagnosis not present

## 2017-01-19 DIAGNOSIS — I700 Atherosclerosis of aorta: Secondary | ICD-10-CM | POA: Diagnosis not present

## 2017-01-19 DIAGNOSIS — R55 Syncope and collapse: Secondary | ICD-10-CM | POA: Diagnosis not present

## 2017-01-19 DIAGNOSIS — S0990XA Unspecified injury of head, initial encounter: Secondary | ICD-10-CM | POA: Diagnosis not present

## 2017-01-20 DIAGNOSIS — R1110 Vomiting, unspecified: Secondary | ICD-10-CM | POA: Diagnosis not present

## 2017-01-20 DIAGNOSIS — F411 Generalized anxiety disorder: Secondary | ICD-10-CM | POA: Diagnosis not present

## 2017-01-20 DIAGNOSIS — R55 Syncope and collapse: Secondary | ICD-10-CM | POA: Diagnosis not present

## 2017-01-20 DIAGNOSIS — I10 Essential (primary) hypertension: Secondary | ICD-10-CM | POA: Diagnosis not present

## 2017-01-21 DIAGNOSIS — R55 Syncope and collapse: Secondary | ICD-10-CM | POA: Diagnosis not present

## 2017-01-21 DIAGNOSIS — Z79899 Other long term (current) drug therapy: Secondary | ICD-10-CM | POA: Diagnosis not present

## 2017-01-21 DIAGNOSIS — F0390 Unspecified dementia without behavioral disturbance: Secondary | ICD-10-CM | POA: Diagnosis not present

## 2017-01-21 DIAGNOSIS — I10 Essential (primary) hypertension: Secondary | ICD-10-CM | POA: Diagnosis not present

## 2017-01-21 DIAGNOSIS — R1110 Vomiting, unspecified: Secondary | ICD-10-CM | POA: Diagnosis not present

## 2017-01-21 DIAGNOSIS — N390 Urinary tract infection, site not specified: Secondary | ICD-10-CM | POA: Diagnosis present

## 2017-01-21 DIAGNOSIS — Z66 Do not resuscitate: Secondary | ICD-10-CM | POA: Diagnosis not present

## 2017-01-21 DIAGNOSIS — F411 Generalized anxiety disorder: Secondary | ICD-10-CM | POA: Diagnosis not present

## 2017-01-23 DIAGNOSIS — Z9181 History of falling: Secondary | ICD-10-CM | POA: Diagnosis not present

## 2017-01-23 DIAGNOSIS — R55 Syncope and collapse: Secondary | ICD-10-CM | POA: Diagnosis not present

## 2017-01-23 DIAGNOSIS — I10 Essential (primary) hypertension: Secondary | ICD-10-CM | POA: Diagnosis not present

## 2017-01-23 DIAGNOSIS — F411 Generalized anxiety disorder: Secondary | ICD-10-CM | POA: Diagnosis not present

## 2017-01-23 DIAGNOSIS — T404X5D Adverse effect of other synthetic narcotics, subsequent encounter: Secondary | ICD-10-CM | POA: Diagnosis not present

## 2017-01-23 DIAGNOSIS — Z7984 Long term (current) use of oral hypoglycemic drugs: Secondary | ICD-10-CM | POA: Diagnosis not present

## 2017-01-23 DIAGNOSIS — F329 Major depressive disorder, single episode, unspecified: Secondary | ICD-10-CM | POA: Diagnosis not present

## 2017-01-23 DIAGNOSIS — E119 Type 2 diabetes mellitus without complications: Secondary | ICD-10-CM | POA: Diagnosis not present

## 2017-01-23 DIAGNOSIS — T424X5D Adverse effect of benzodiazepines, subsequent encounter: Secondary | ICD-10-CM | POA: Diagnosis not present

## 2017-01-23 DIAGNOSIS — R531 Weakness: Secondary | ICD-10-CM | POA: Diagnosis not present

## 2017-01-26 DIAGNOSIS — I10 Essential (primary) hypertension: Secondary | ICD-10-CM | POA: Diagnosis not present

## 2017-01-26 DIAGNOSIS — Z6826 Body mass index (BMI) 26.0-26.9, adult: Secondary | ICD-10-CM | POA: Diagnosis not present

## 2017-01-26 DIAGNOSIS — F411 Generalized anxiety disorder: Secondary | ICD-10-CM | POA: Diagnosis not present

## 2017-01-26 DIAGNOSIS — R251 Tremor, unspecified: Secondary | ICD-10-CM | POA: Diagnosis not present

## 2017-01-26 DIAGNOSIS — R112 Nausea with vomiting, unspecified: Secondary | ICD-10-CM | POA: Diagnosis not present

## 2017-01-27 DIAGNOSIS — T404X5D Adverse effect of other synthetic narcotics, subsequent encounter: Secondary | ICD-10-CM | POA: Diagnosis not present

## 2017-01-27 DIAGNOSIS — R55 Syncope and collapse: Secondary | ICD-10-CM | POA: Diagnosis not present

## 2017-01-27 DIAGNOSIS — I10 Essential (primary) hypertension: Secondary | ICD-10-CM | POA: Diagnosis not present

## 2017-01-27 DIAGNOSIS — E119 Type 2 diabetes mellitus without complications: Secondary | ICD-10-CM | POA: Diagnosis not present

## 2017-01-27 DIAGNOSIS — R531 Weakness: Secondary | ICD-10-CM | POA: Diagnosis not present

## 2017-01-27 DIAGNOSIS — T424X5D Adverse effect of benzodiazepines, subsequent encounter: Secondary | ICD-10-CM | POA: Diagnosis not present

## 2017-01-30 DIAGNOSIS — T424X5D Adverse effect of benzodiazepines, subsequent encounter: Secondary | ICD-10-CM | POA: Diagnosis not present

## 2017-01-30 DIAGNOSIS — R531 Weakness: Secondary | ICD-10-CM | POA: Diagnosis not present

## 2017-01-30 DIAGNOSIS — I10 Essential (primary) hypertension: Secondary | ICD-10-CM | POA: Diagnosis not present

## 2017-01-30 DIAGNOSIS — T404X5D Adverse effect of other synthetic narcotics, subsequent encounter: Secondary | ICD-10-CM | POA: Diagnosis not present

## 2017-01-30 DIAGNOSIS — E119 Type 2 diabetes mellitus without complications: Secondary | ICD-10-CM | POA: Diagnosis not present

## 2017-01-30 DIAGNOSIS — R55 Syncope and collapse: Secondary | ICD-10-CM | POA: Diagnosis not present

## 2017-02-02 DIAGNOSIS — I10 Essential (primary) hypertension: Secondary | ICD-10-CM | POA: Diagnosis not present

## 2017-02-02 DIAGNOSIS — R55 Syncope and collapse: Secondary | ICD-10-CM | POA: Diagnosis not present

## 2017-02-02 DIAGNOSIS — R531 Weakness: Secondary | ICD-10-CM | POA: Diagnosis not present

## 2017-02-02 DIAGNOSIS — T424X5D Adverse effect of benzodiazepines, subsequent encounter: Secondary | ICD-10-CM | POA: Diagnosis not present

## 2017-02-02 DIAGNOSIS — E119 Type 2 diabetes mellitus without complications: Secondary | ICD-10-CM | POA: Diagnosis not present

## 2017-02-02 DIAGNOSIS — T404X5D Adverse effect of other synthetic narcotics, subsequent encounter: Secondary | ICD-10-CM | POA: Diagnosis not present

## 2017-02-03 DIAGNOSIS — F200 Paranoid schizophrenia: Secondary | ICD-10-CM | POA: Diagnosis not present

## 2017-02-03 DIAGNOSIS — R251 Tremor, unspecified: Secondary | ICD-10-CM | POA: Diagnosis not present

## 2017-02-03 DIAGNOSIS — E538 Deficiency of other specified B group vitamins: Secondary | ICD-10-CM | POA: Diagnosis not present

## 2017-02-03 DIAGNOSIS — G4733 Obstructive sleep apnea (adult) (pediatric): Secondary | ICD-10-CM | POA: Diagnosis not present

## 2017-02-03 DIAGNOSIS — R7309 Other abnormal glucose: Secondary | ICD-10-CM | POA: Diagnosis not present

## 2017-02-03 DIAGNOSIS — Z6825 Body mass index (BMI) 25.0-25.9, adult: Secondary | ICD-10-CM | POA: Diagnosis not present

## 2017-02-03 DIAGNOSIS — I10 Essential (primary) hypertension: Secondary | ICD-10-CM | POA: Diagnosis not present

## 2017-02-03 DIAGNOSIS — I6789 Other cerebrovascular disease: Secondary | ICD-10-CM | POA: Diagnosis not present

## 2017-02-03 DIAGNOSIS — E785 Hyperlipidemia, unspecified: Secondary | ICD-10-CM | POA: Diagnosis not present

## 2017-02-03 DIAGNOSIS — F411 Generalized anxiety disorder: Secondary | ICD-10-CM | POA: Diagnosis not present

## 2017-02-10 DIAGNOSIS — R531 Weakness: Secondary | ICD-10-CM | POA: Diagnosis not present

## 2017-02-10 DIAGNOSIS — R55 Syncope and collapse: Secondary | ICD-10-CM | POA: Diagnosis not present

## 2017-02-10 DIAGNOSIS — E119 Type 2 diabetes mellitus without complications: Secondary | ICD-10-CM | POA: Diagnosis not present

## 2017-02-10 DIAGNOSIS — I10 Essential (primary) hypertension: Secondary | ICD-10-CM | POA: Diagnosis not present

## 2017-02-10 DIAGNOSIS — T424X5D Adverse effect of benzodiazepines, subsequent encounter: Secondary | ICD-10-CM | POA: Diagnosis not present

## 2017-02-10 DIAGNOSIS — T404X5D Adverse effect of other synthetic narcotics, subsequent encounter: Secondary | ICD-10-CM | POA: Diagnosis not present

## 2017-02-11 DIAGNOSIS — R55 Syncope and collapse: Secondary | ICD-10-CM | POA: Diagnosis not present

## 2017-02-11 DIAGNOSIS — I6789 Other cerebrovascular disease: Secondary | ICD-10-CM | POA: Diagnosis not present

## 2017-02-16 DIAGNOSIS — F209 Schizophrenia, unspecified: Secondary | ICD-10-CM | POA: Diagnosis not present

## 2017-02-19 DIAGNOSIS — R251 Tremor, unspecified: Secondary | ICD-10-CM | POA: Diagnosis not present

## 2017-02-19 DIAGNOSIS — I10 Essential (primary) hypertension: Secondary | ICD-10-CM | POA: Diagnosis not present

## 2017-02-19 DIAGNOSIS — F200 Paranoid schizophrenia: Secondary | ICD-10-CM | POA: Diagnosis not present

## 2017-02-25 DIAGNOSIS — F209 Schizophrenia, unspecified: Secondary | ICD-10-CM | POA: Diagnosis not present

## 2017-03-09 DIAGNOSIS — R001 Bradycardia, unspecified: Secondary | ICD-10-CM | POA: Diagnosis not present

## 2017-03-12 DIAGNOSIS — R001 Bradycardia, unspecified: Secondary | ICD-10-CM | POA: Diagnosis not present

## 2017-03-12 DIAGNOSIS — R262 Difficulty in walking, not elsewhere classified: Secondary | ICD-10-CM | POA: Diagnosis not present

## 2017-03-12 DIAGNOSIS — F200 Paranoid schizophrenia: Secondary | ICD-10-CM | POA: Diagnosis not present

## 2017-03-12 DIAGNOSIS — I10 Essential (primary) hypertension: Secondary | ICD-10-CM | POA: Diagnosis not present

## 2017-04-15 DIAGNOSIS — F209 Schizophrenia, unspecified: Secondary | ICD-10-CM | POA: Diagnosis not present

## 2017-04-16 DIAGNOSIS — Z23 Encounter for immunization: Secondary | ICD-10-CM | POA: Diagnosis not present

## 2017-04-21 DIAGNOSIS — H2703 Aphakia, bilateral: Secondary | ICD-10-CM | POA: Diagnosis not present

## 2017-04-21 DIAGNOSIS — E119 Type 2 diabetes mellitus without complications: Secondary | ICD-10-CM | POA: Diagnosis not present

## 2017-04-23 DIAGNOSIS — R7309 Other abnormal glucose: Secondary | ICD-10-CM | POA: Diagnosis not present

## 2017-04-23 DIAGNOSIS — R9431 Abnormal electrocardiogram [ECG] [EKG]: Secondary | ICD-10-CM | POA: Diagnosis not present

## 2017-04-23 DIAGNOSIS — F200 Paranoid schizophrenia: Secondary | ICD-10-CM | POA: Diagnosis not present

## 2017-04-23 DIAGNOSIS — I10 Essential (primary) hypertension: Secondary | ICD-10-CM | POA: Diagnosis not present

## 2017-04-23 DIAGNOSIS — R262 Difficulty in walking, not elsewhere classified: Secondary | ICD-10-CM | POA: Diagnosis not present

## 2017-05-13 DIAGNOSIS — F209 Schizophrenia, unspecified: Secondary | ICD-10-CM | POA: Diagnosis not present

## 2017-05-13 DIAGNOSIS — I10 Essential (primary) hypertension: Secondary | ICD-10-CM | POA: Diagnosis not present

## 2017-05-13 DIAGNOSIS — J208 Acute bronchitis due to other specified organisms: Secondary | ICD-10-CM | POA: Diagnosis not present

## 2017-05-18 DIAGNOSIS — I10 Essential (primary) hypertension: Secondary | ICD-10-CM

## 2017-05-18 DIAGNOSIS — I110 Hypertensive heart disease with heart failure: Secondary | ICD-10-CM | POA: Insufficient documentation

## 2017-05-18 DIAGNOSIS — R001 Bradycardia, unspecified: Secondary | ICD-10-CM | POA: Diagnosis not present

## 2017-05-18 DIAGNOSIS — I471 Supraventricular tachycardia: Secondary | ICD-10-CM

## 2017-05-18 DIAGNOSIS — R55 Syncope and collapse: Secondary | ICD-10-CM

## 2017-05-18 DIAGNOSIS — E785 Hyperlipidemia, unspecified: Secondary | ICD-10-CM

## 2017-05-18 DIAGNOSIS — I4719 Other supraventricular tachycardia: Secondary | ICD-10-CM

## 2017-05-18 DIAGNOSIS — R9431 Abnormal electrocardiogram [ECG] [EKG]: Secondary | ICD-10-CM

## 2017-05-18 HISTORY — DX: Essential (primary) hypertension: I10

## 2017-05-18 HISTORY — DX: Supraventricular tachycardia: I47.1

## 2017-05-18 HISTORY — DX: Hyperlipidemia, unspecified: E78.5

## 2017-05-18 HISTORY — DX: Other supraventricular tachycardia: I47.19

## 2017-05-18 HISTORY — DX: Syncope and collapse: R55

## 2017-05-18 HISTORY — DX: Abnormal electrocardiogram (ECG) (EKG): R94.31

## 2017-05-29 DIAGNOSIS — N3000 Acute cystitis without hematuria: Secondary | ICD-10-CM | POA: Diagnosis not present

## 2017-05-29 DIAGNOSIS — R1110 Vomiting, unspecified: Secondary | ICD-10-CM | POA: Diagnosis not present

## 2017-05-29 DIAGNOSIS — N3090 Cystitis, unspecified without hematuria: Secondary | ICD-10-CM | POA: Diagnosis not present

## 2017-05-30 DIAGNOSIS — R300 Dysuria: Secondary | ICD-10-CM | POA: Diagnosis not present

## 2017-05-30 DIAGNOSIS — E871 Hypo-osmolality and hyponatremia: Secondary | ICD-10-CM | POA: Diagnosis not present

## 2017-05-30 DIAGNOSIS — N2889 Other specified disorders of kidney and ureter: Secondary | ICD-10-CM | POA: Diagnosis not present

## 2017-05-30 DIAGNOSIS — R109 Unspecified abdominal pain: Secondary | ICD-10-CM | POA: Diagnosis not present

## 2017-06-02 DIAGNOSIS — Z9181 History of falling: Secondary | ICD-10-CM | POA: Diagnosis not present

## 2017-06-02 DIAGNOSIS — I10 Essential (primary) hypertension: Secondary | ICD-10-CM | POA: Diagnosis not present

## 2017-06-02 DIAGNOSIS — E871 Hypo-osmolality and hyponatremia: Secondary | ICD-10-CM | POA: Diagnosis not present

## 2017-06-02 DIAGNOSIS — D539 Nutritional anemia, unspecified: Secondary | ICD-10-CM | POA: Diagnosis not present

## 2017-06-02 DIAGNOSIS — N390 Urinary tract infection, site not specified: Secondary | ICD-10-CM | POA: Diagnosis not present

## 2017-06-02 DIAGNOSIS — R531 Weakness: Secondary | ICD-10-CM | POA: Diagnosis not present

## 2017-06-02 DIAGNOSIS — R3989 Other symptoms and signs involving the genitourinary system: Secondary | ICD-10-CM | POA: Diagnosis not present

## 2017-06-09 DIAGNOSIS — R3989 Other symptoms and signs involving the genitourinary system: Secondary | ICD-10-CM | POA: Diagnosis not present

## 2017-06-09 DIAGNOSIS — D539 Nutritional anemia, unspecified: Secondary | ICD-10-CM | POA: Diagnosis not present

## 2017-06-09 DIAGNOSIS — R531 Weakness: Secondary | ICD-10-CM | POA: Diagnosis not present

## 2017-06-09 DIAGNOSIS — R112 Nausea with vomiting, unspecified: Secondary | ICD-10-CM | POA: Diagnosis not present

## 2017-06-09 DIAGNOSIS — E871 Hypo-osmolality and hyponatremia: Secondary | ICD-10-CM | POA: Diagnosis not present

## 2017-06-10 DIAGNOSIS — F209 Schizophrenia, unspecified: Secondary | ICD-10-CM | POA: Diagnosis not present

## 2017-06-17 DIAGNOSIS — R7309 Other abnormal glucose: Secondary | ICD-10-CM | POA: Diagnosis not present

## 2017-06-17 DIAGNOSIS — I10 Essential (primary) hypertension: Secondary | ICD-10-CM | POA: Diagnosis not present

## 2017-06-17 DIAGNOSIS — D539 Nutritional anemia, unspecified: Secondary | ICD-10-CM | POA: Diagnosis not present

## 2017-06-17 DIAGNOSIS — E875 Hyperkalemia: Secondary | ICD-10-CM | POA: Diagnosis not present

## 2017-06-17 DIAGNOSIS — R3989 Other symptoms and signs involving the genitourinary system: Secondary | ICD-10-CM | POA: Diagnosis not present

## 2017-06-17 DIAGNOSIS — F200 Paranoid schizophrenia: Secondary | ICD-10-CM | POA: Diagnosis not present

## 2017-06-19 DIAGNOSIS — R102 Pelvic and perineal pain: Secondary | ICD-10-CM | POA: Diagnosis not present

## 2017-06-19 DIAGNOSIS — R42 Dizziness and giddiness: Secondary | ICD-10-CM | POA: Diagnosis not present

## 2017-06-19 DIAGNOSIS — B373 Candidiasis of vulva and vagina: Secondary | ICD-10-CM | POA: Diagnosis not present

## 2017-06-19 DIAGNOSIS — R0602 Shortness of breath: Secondary | ICD-10-CM | POA: Diagnosis not present

## 2017-06-19 DIAGNOSIS — D649 Anemia, unspecified: Secondary | ICD-10-CM | POA: Diagnosis not present

## 2017-06-19 DIAGNOSIS — R079 Chest pain, unspecified: Secondary | ICD-10-CM | POA: Diagnosis not present

## 2017-06-19 DIAGNOSIS — R5383 Other fatigue: Secondary | ICD-10-CM | POA: Diagnosis not present

## 2017-06-20 DIAGNOSIS — B373 Candidiasis of vulva and vagina: Secondary | ICD-10-CM | POA: Diagnosis not present

## 2017-06-20 DIAGNOSIS — R5383 Other fatigue: Secondary | ICD-10-CM | POA: Diagnosis not present

## 2017-06-20 DIAGNOSIS — R079 Chest pain, unspecified: Secondary | ICD-10-CM | POA: Diagnosis not present

## 2017-06-20 DIAGNOSIS — R102 Pelvic and perineal pain: Secondary | ICD-10-CM | POA: Diagnosis not present

## 2017-06-20 DIAGNOSIS — R42 Dizziness and giddiness: Secondary | ICD-10-CM | POA: Diagnosis not present

## 2017-06-20 DIAGNOSIS — D649 Anemia, unspecified: Secondary | ICD-10-CM | POA: Diagnosis not present

## 2017-06-21 DIAGNOSIS — B373 Candidiasis of vulva and vagina: Secondary | ICD-10-CM | POA: Diagnosis not present

## 2017-06-21 DIAGNOSIS — R102 Pelvic and perineal pain: Secondary | ICD-10-CM | POA: Diagnosis not present

## 2017-06-21 DIAGNOSIS — R42 Dizziness and giddiness: Secondary | ICD-10-CM | POA: Diagnosis not present

## 2017-06-21 DIAGNOSIS — D649 Anemia, unspecified: Secondary | ICD-10-CM | POA: Diagnosis not present

## 2017-06-21 DIAGNOSIS — R079 Chest pain, unspecified: Secondary | ICD-10-CM | POA: Diagnosis not present

## 2017-06-21 DIAGNOSIS — R5383 Other fatigue: Secondary | ICD-10-CM | POA: Diagnosis not present

## 2017-06-22 DIAGNOSIS — K5900 Constipation, unspecified: Secondary | ICD-10-CM | POA: Diagnosis not present

## 2017-06-22 DIAGNOSIS — R5383 Other fatigue: Secondary | ICD-10-CM | POA: Diagnosis not present

## 2017-06-22 DIAGNOSIS — B373 Candidiasis of vulva and vagina: Secondary | ICD-10-CM | POA: Diagnosis not present

## 2017-06-22 DIAGNOSIS — D649 Anemia, unspecified: Secondary | ICD-10-CM | POA: Diagnosis not present

## 2017-06-22 DIAGNOSIS — R079 Chest pain, unspecified: Secondary | ICD-10-CM | POA: Diagnosis not present

## 2017-06-22 DIAGNOSIS — I10 Essential (primary) hypertension: Secondary | ICD-10-CM | POA: Diagnosis not present

## 2017-06-22 DIAGNOSIS — R102 Pelvic and perineal pain: Secondary | ICD-10-CM | POA: Diagnosis not present

## 2017-06-22 DIAGNOSIS — R42 Dizziness and giddiness: Secondary | ICD-10-CM | POA: Diagnosis not present

## 2017-06-23 DIAGNOSIS — R079 Chest pain, unspecified: Secondary | ICD-10-CM | POA: Diagnosis not present

## 2017-06-23 DIAGNOSIS — R5383 Other fatigue: Secondary | ICD-10-CM | POA: Diagnosis not present

## 2017-06-23 DIAGNOSIS — R102 Pelvic and perineal pain: Secondary | ICD-10-CM | POA: Diagnosis not present

## 2017-06-23 DIAGNOSIS — R42 Dizziness and giddiness: Secondary | ICD-10-CM | POA: Diagnosis not present

## 2017-06-23 DIAGNOSIS — B373 Candidiasis of vulva and vagina: Secondary | ICD-10-CM | POA: Diagnosis not present

## 2017-06-23 DIAGNOSIS — D649 Anemia, unspecified: Secondary | ICD-10-CM | POA: Diagnosis not present

## 2017-06-24 DIAGNOSIS — B373 Candidiasis of vulva and vagina: Secondary | ICD-10-CM | POA: Diagnosis not present

## 2017-06-24 DIAGNOSIS — R5383 Other fatigue: Secondary | ICD-10-CM | POA: Diagnosis not present

## 2017-06-24 DIAGNOSIS — R102 Pelvic and perineal pain: Secondary | ICD-10-CM | POA: Diagnosis not present

## 2017-06-24 DIAGNOSIS — R079 Chest pain, unspecified: Secondary | ICD-10-CM | POA: Diagnosis not present

## 2017-06-24 DIAGNOSIS — D649 Anemia, unspecified: Secondary | ICD-10-CM | POA: Diagnosis not present

## 2017-06-24 DIAGNOSIS — R42 Dizziness and giddiness: Secondary | ICD-10-CM | POA: Diagnosis not present

## 2017-06-25 DIAGNOSIS — D649 Anemia, unspecified: Secondary | ICD-10-CM | POA: Diagnosis not present

## 2017-06-25 DIAGNOSIS — R102 Pelvic and perineal pain: Secondary | ICD-10-CM | POA: Diagnosis not present

## 2017-06-25 DIAGNOSIS — B373 Candidiasis of vulva and vagina: Secondary | ICD-10-CM | POA: Diagnosis not present

## 2017-06-25 DIAGNOSIS — R079 Chest pain, unspecified: Secondary | ICD-10-CM | POA: Diagnosis not present

## 2017-06-25 DIAGNOSIS — R5383 Other fatigue: Secondary | ICD-10-CM | POA: Diagnosis not present

## 2017-06-25 DIAGNOSIS — R42 Dizziness and giddiness: Secondary | ICD-10-CM | POA: Diagnosis not present

## 2017-06-27 DIAGNOSIS — Z9181 History of falling: Secondary | ICD-10-CM | POA: Diagnosis not present

## 2017-06-27 DIAGNOSIS — Z7982 Long term (current) use of aspirin: Secondary | ICD-10-CM | POA: Diagnosis not present

## 2017-06-27 DIAGNOSIS — Z7984 Long term (current) use of oral hypoglycemic drugs: Secondary | ICD-10-CM | POA: Diagnosis not present

## 2017-06-27 DIAGNOSIS — N390 Urinary tract infection, site not specified: Secondary | ICD-10-CM | POA: Diagnosis not present

## 2017-06-27 DIAGNOSIS — F319 Bipolar disorder, unspecified: Secondary | ICD-10-CM | POA: Diagnosis not present

## 2017-06-27 DIAGNOSIS — E119 Type 2 diabetes mellitus without complications: Secondary | ICD-10-CM | POA: Diagnosis not present

## 2017-06-27 DIAGNOSIS — I5033 Acute on chronic diastolic (congestive) heart failure: Secondary | ICD-10-CM | POA: Diagnosis not present

## 2017-06-27 DIAGNOSIS — D649 Anemia, unspecified: Secondary | ICD-10-CM | POA: Diagnosis not present

## 2017-06-27 DIAGNOSIS — F411 Generalized anxiety disorder: Secondary | ICD-10-CM | POA: Diagnosis not present

## 2017-06-27 DIAGNOSIS — I110 Hypertensive heart disease with heart failure: Secondary | ICD-10-CM | POA: Diagnosis not present

## 2017-06-27 DIAGNOSIS — M1991 Primary osteoarthritis, unspecified site: Secondary | ICD-10-CM | POA: Diagnosis not present

## 2017-07-01 DIAGNOSIS — E119 Type 2 diabetes mellitus without complications: Secondary | ICD-10-CM | POA: Diagnosis not present

## 2017-07-01 DIAGNOSIS — I5033 Acute on chronic diastolic (congestive) heart failure: Secondary | ICD-10-CM | POA: Diagnosis not present

## 2017-07-01 DIAGNOSIS — I110 Hypertensive heart disease with heart failure: Secondary | ICD-10-CM | POA: Diagnosis not present

## 2017-07-01 DIAGNOSIS — N390 Urinary tract infection, site not specified: Secondary | ICD-10-CM | POA: Diagnosis not present

## 2017-07-01 DIAGNOSIS — D649 Anemia, unspecified: Secondary | ICD-10-CM | POA: Diagnosis not present

## 2017-07-01 DIAGNOSIS — F411 Generalized anxiety disorder: Secondary | ICD-10-CM | POA: Diagnosis not present

## 2017-07-03 DIAGNOSIS — N390 Urinary tract infection, site not specified: Secondary | ICD-10-CM | POA: Diagnosis not present

## 2017-07-03 DIAGNOSIS — F411 Generalized anxiety disorder: Secondary | ICD-10-CM | POA: Diagnosis not present

## 2017-07-03 DIAGNOSIS — D649 Anemia, unspecified: Secondary | ICD-10-CM | POA: Diagnosis not present

## 2017-07-03 DIAGNOSIS — E119 Type 2 diabetes mellitus without complications: Secondary | ICD-10-CM | POA: Diagnosis not present

## 2017-07-03 DIAGNOSIS — I110 Hypertensive heart disease with heart failure: Secondary | ICD-10-CM | POA: Diagnosis not present

## 2017-07-03 DIAGNOSIS — I5033 Acute on chronic diastolic (congestive) heart failure: Secondary | ICD-10-CM | POA: Diagnosis not present

## 2017-07-04 DIAGNOSIS — I5032 Chronic diastolic (congestive) heart failure: Secondary | ICD-10-CM | POA: Diagnosis not present

## 2017-07-04 DIAGNOSIS — R05 Cough: Secondary | ICD-10-CM | POA: Diagnosis not present

## 2017-07-04 DIAGNOSIS — Z79899 Other long term (current) drug therapy: Secondary | ICD-10-CM | POA: Diagnosis not present

## 2017-07-04 DIAGNOSIS — R531 Weakness: Secondary | ICD-10-CM | POA: Diagnosis not present

## 2017-07-04 DIAGNOSIS — E871 Hypo-osmolality and hyponatremia: Secondary | ICD-10-CM | POA: Diagnosis not present

## 2017-07-04 DIAGNOSIS — K649 Unspecified hemorrhoids: Secondary | ICD-10-CM | POA: Diagnosis not present

## 2017-07-04 DIAGNOSIS — R112 Nausea with vomiting, unspecified: Secondary | ICD-10-CM | POA: Diagnosis not present

## 2017-07-04 DIAGNOSIS — R109 Unspecified abdominal pain: Secondary | ICD-10-CM | POA: Diagnosis not present

## 2017-07-04 DIAGNOSIS — I161 Hypertensive emergency: Secondary | ICD-10-CM | POA: Diagnosis not present

## 2017-07-04 DIAGNOSIS — R1110 Vomiting, unspecified: Secondary | ICD-10-CM | POA: Diagnosis not present

## 2017-07-04 DIAGNOSIS — R0602 Shortness of breath: Secondary | ICD-10-CM | POA: Diagnosis not present

## 2017-07-04 DIAGNOSIS — M545 Low back pain: Secondary | ICD-10-CM | POA: Diagnosis not present

## 2017-07-04 DIAGNOSIS — I110 Hypertensive heart disease with heart failure: Secondary | ICD-10-CM | POA: Diagnosis not present

## 2017-07-04 DIAGNOSIS — Z7982 Long term (current) use of aspirin: Secondary | ICD-10-CM | POA: Diagnosis not present

## 2017-07-04 DIAGNOSIS — E119 Type 2 diabetes mellitus without complications: Secondary | ICD-10-CM | POA: Diagnosis not present

## 2017-07-04 DIAGNOSIS — G8929 Other chronic pain: Secondary | ICD-10-CM | POA: Diagnosis not present

## 2017-07-04 DIAGNOSIS — Z7984 Long term (current) use of oral hypoglycemic drugs: Secondary | ICD-10-CM | POA: Diagnosis not present

## 2017-07-04 DIAGNOSIS — F411 Generalized anxiety disorder: Secondary | ICD-10-CM | POA: Diagnosis not present

## 2017-07-04 DIAGNOSIS — I160 Hypertensive urgency: Secondary | ICD-10-CM | POA: Diagnosis not present

## 2017-07-05 DIAGNOSIS — F411 Generalized anxiety disorder: Secondary | ICD-10-CM | POA: Diagnosis not present

## 2017-07-05 DIAGNOSIS — I160 Hypertensive urgency: Secondary | ICD-10-CM | POA: Diagnosis not present

## 2017-07-05 DIAGNOSIS — R1110 Vomiting, unspecified: Secondary | ICD-10-CM | POA: Diagnosis not present

## 2017-07-05 DIAGNOSIS — E871 Hypo-osmolality and hyponatremia: Secondary | ICD-10-CM | POA: Diagnosis not present

## 2017-07-06 DIAGNOSIS — R001 Bradycardia, unspecified: Secondary | ICD-10-CM | POA: Diagnosis not present

## 2017-07-06 DIAGNOSIS — E871 Hypo-osmolality and hyponatremia: Secondary | ICD-10-CM | POA: Diagnosis not present

## 2017-07-06 DIAGNOSIS — R531 Weakness: Secondary | ICD-10-CM | POA: Diagnosis not present

## 2017-07-06 DIAGNOSIS — I160 Hypertensive urgency: Secondary | ICD-10-CM | POA: Diagnosis not present

## 2017-07-06 DIAGNOSIS — F411 Generalized anxiety disorder: Secondary | ICD-10-CM | POA: Diagnosis not present

## 2017-07-06 DIAGNOSIS — D539 Nutritional anemia, unspecified: Secondary | ICD-10-CM | POA: Diagnosis not present

## 2017-07-06 DIAGNOSIS — K6289 Other specified diseases of anus and rectum: Secondary | ICD-10-CM | POA: Diagnosis not present

## 2017-07-06 DIAGNOSIS — Z79899 Other long term (current) drug therapy: Secondary | ICD-10-CM | POA: Diagnosis not present

## 2017-07-06 DIAGNOSIS — I5031 Acute diastolic (congestive) heart failure: Secondary | ICD-10-CM | POA: Diagnosis not present

## 2017-07-06 DIAGNOSIS — F200 Paranoid schizophrenia: Secondary | ICD-10-CM | POA: Diagnosis not present

## 2017-07-07 DIAGNOSIS — I110 Hypertensive heart disease with heart failure: Secondary | ICD-10-CM | POA: Diagnosis not present

## 2017-07-07 DIAGNOSIS — I5033 Acute on chronic diastolic (congestive) heart failure: Secondary | ICD-10-CM | POA: Diagnosis not present

## 2017-07-07 DIAGNOSIS — E119 Type 2 diabetes mellitus without complications: Secondary | ICD-10-CM | POA: Diagnosis not present

## 2017-07-07 DIAGNOSIS — F411 Generalized anxiety disorder: Secondary | ICD-10-CM | POA: Diagnosis not present

## 2017-07-07 DIAGNOSIS — N390 Urinary tract infection, site not specified: Secondary | ICD-10-CM | POA: Diagnosis not present

## 2017-07-07 DIAGNOSIS — D649 Anemia, unspecified: Secondary | ICD-10-CM | POA: Diagnosis not present

## 2017-07-08 DIAGNOSIS — I110 Hypertensive heart disease with heart failure: Secondary | ICD-10-CM | POA: Diagnosis not present

## 2017-07-08 DIAGNOSIS — E119 Type 2 diabetes mellitus without complications: Secondary | ICD-10-CM | POA: Diagnosis not present

## 2017-07-08 DIAGNOSIS — F411 Generalized anxiety disorder: Secondary | ICD-10-CM | POA: Diagnosis not present

## 2017-07-08 DIAGNOSIS — D649 Anemia, unspecified: Secondary | ICD-10-CM | POA: Diagnosis not present

## 2017-07-08 DIAGNOSIS — I5033 Acute on chronic diastolic (congestive) heart failure: Secondary | ICD-10-CM | POA: Diagnosis not present

## 2017-07-08 DIAGNOSIS — N390 Urinary tract infection, site not specified: Secondary | ICD-10-CM | POA: Diagnosis not present

## 2017-07-09 DIAGNOSIS — N390 Urinary tract infection, site not specified: Secondary | ICD-10-CM | POA: Diagnosis not present

## 2017-07-09 DIAGNOSIS — F411 Generalized anxiety disorder: Secondary | ICD-10-CM | POA: Diagnosis not present

## 2017-07-09 DIAGNOSIS — I110 Hypertensive heart disease with heart failure: Secondary | ICD-10-CM | POA: Diagnosis not present

## 2017-07-09 DIAGNOSIS — D649 Anemia, unspecified: Secondary | ICD-10-CM | POA: Diagnosis not present

## 2017-07-09 DIAGNOSIS — I5033 Acute on chronic diastolic (congestive) heart failure: Secondary | ICD-10-CM | POA: Diagnosis not present

## 2017-07-09 DIAGNOSIS — E119 Type 2 diabetes mellitus without complications: Secondary | ICD-10-CM | POA: Diagnosis not present

## 2017-07-10 DIAGNOSIS — I110 Hypertensive heart disease with heart failure: Secondary | ICD-10-CM | POA: Diagnosis not present

## 2017-07-10 DIAGNOSIS — D649 Anemia, unspecified: Secondary | ICD-10-CM | POA: Diagnosis not present

## 2017-07-10 DIAGNOSIS — E119 Type 2 diabetes mellitus without complications: Secondary | ICD-10-CM | POA: Diagnosis not present

## 2017-07-10 DIAGNOSIS — N390 Urinary tract infection, site not specified: Secondary | ICD-10-CM | POA: Diagnosis not present

## 2017-07-10 DIAGNOSIS — I5033 Acute on chronic diastolic (congestive) heart failure: Secondary | ICD-10-CM | POA: Diagnosis not present

## 2017-07-10 DIAGNOSIS — F411 Generalized anxiety disorder: Secondary | ICD-10-CM | POA: Diagnosis not present

## 2017-07-14 DIAGNOSIS — D649 Anemia, unspecified: Secondary | ICD-10-CM | POA: Diagnosis not present

## 2017-07-14 DIAGNOSIS — E119 Type 2 diabetes mellitus without complications: Secondary | ICD-10-CM | POA: Diagnosis not present

## 2017-07-14 DIAGNOSIS — F411 Generalized anxiety disorder: Secondary | ICD-10-CM | POA: Diagnosis not present

## 2017-07-14 DIAGNOSIS — I110 Hypertensive heart disease with heart failure: Secondary | ICD-10-CM | POA: Diagnosis not present

## 2017-07-14 DIAGNOSIS — N390 Urinary tract infection, site not specified: Secondary | ICD-10-CM | POA: Diagnosis not present

## 2017-07-14 DIAGNOSIS — I5033 Acute on chronic diastolic (congestive) heart failure: Secondary | ICD-10-CM | POA: Diagnosis not present

## 2017-07-15 DIAGNOSIS — F419 Anxiety disorder, unspecified: Secondary | ICD-10-CM | POA: Diagnosis not present

## 2017-07-15 DIAGNOSIS — F209 Schizophrenia, unspecified: Secondary | ICD-10-CM | POA: Diagnosis not present

## 2017-07-17 DIAGNOSIS — E119 Type 2 diabetes mellitus without complications: Secondary | ICD-10-CM | POA: Diagnosis not present

## 2017-07-17 DIAGNOSIS — K6289 Other specified diseases of anus and rectum: Secondary | ICD-10-CM | POA: Diagnosis not present

## 2017-07-17 DIAGNOSIS — I5033 Acute on chronic diastolic (congestive) heart failure: Secondary | ICD-10-CM | POA: Diagnosis not present

## 2017-07-17 DIAGNOSIS — N390 Urinary tract infection, site not specified: Secondary | ICD-10-CM | POA: Diagnosis not present

## 2017-07-17 DIAGNOSIS — I110 Hypertensive heart disease with heart failure: Secondary | ICD-10-CM | POA: Diagnosis not present

## 2017-07-17 DIAGNOSIS — F411 Generalized anxiety disorder: Secondary | ICD-10-CM | POA: Diagnosis not present

## 2017-07-17 DIAGNOSIS — K649 Unspecified hemorrhoids: Secondary | ICD-10-CM | POA: Diagnosis not present

## 2017-07-17 DIAGNOSIS — D649 Anemia, unspecified: Secondary | ICD-10-CM | POA: Diagnosis not present

## 2017-07-20 DIAGNOSIS — I10 Essential (primary) hypertension: Secondary | ICD-10-CM | POA: Diagnosis not present

## 2017-07-20 DIAGNOSIS — Z6826 Body mass index (BMI) 26.0-26.9, adult: Secondary | ICD-10-CM | POA: Diagnosis not present

## 2017-07-20 DIAGNOSIS — K6289 Other specified diseases of anus and rectum: Secondary | ICD-10-CM | POA: Diagnosis not present

## 2017-07-21 DIAGNOSIS — D649 Anemia, unspecified: Secondary | ICD-10-CM | POA: Diagnosis not present

## 2017-07-21 DIAGNOSIS — F411 Generalized anxiety disorder: Secondary | ICD-10-CM | POA: Diagnosis not present

## 2017-07-21 DIAGNOSIS — N390 Urinary tract infection, site not specified: Secondary | ICD-10-CM | POA: Diagnosis not present

## 2017-07-21 DIAGNOSIS — I110 Hypertensive heart disease with heart failure: Secondary | ICD-10-CM | POA: Diagnosis not present

## 2017-07-21 DIAGNOSIS — I5033 Acute on chronic diastolic (congestive) heart failure: Secondary | ICD-10-CM | POA: Diagnosis not present

## 2017-07-21 DIAGNOSIS — E119 Type 2 diabetes mellitus without complications: Secondary | ICD-10-CM | POA: Diagnosis not present

## 2017-07-23 DIAGNOSIS — I5031 Acute diastolic (congestive) heart failure: Secondary | ICD-10-CM | POA: Diagnosis not present

## 2017-07-23 DIAGNOSIS — K6289 Other specified diseases of anus and rectum: Secondary | ICD-10-CM | POA: Diagnosis not present

## 2017-07-23 DIAGNOSIS — R112 Nausea with vomiting, unspecified: Secondary | ICD-10-CM | POA: Diagnosis not present

## 2017-07-23 DIAGNOSIS — R635 Abnormal weight gain: Secondary | ICD-10-CM | POA: Diagnosis not present

## 2017-07-23 DIAGNOSIS — E871 Hypo-osmolality and hyponatremia: Secondary | ICD-10-CM | POA: Diagnosis not present

## 2017-07-23 DIAGNOSIS — I10 Essential (primary) hypertension: Secondary | ICD-10-CM | POA: Diagnosis not present

## 2017-07-24 DIAGNOSIS — D649 Anemia, unspecified: Secondary | ICD-10-CM | POA: Diagnosis not present

## 2017-07-24 DIAGNOSIS — I110 Hypertensive heart disease with heart failure: Secondary | ICD-10-CM | POA: Diagnosis not present

## 2017-07-24 DIAGNOSIS — I5033 Acute on chronic diastolic (congestive) heart failure: Secondary | ICD-10-CM | POA: Diagnosis not present

## 2017-07-24 DIAGNOSIS — N390 Urinary tract infection, site not specified: Secondary | ICD-10-CM | POA: Diagnosis not present

## 2017-07-24 DIAGNOSIS — E119 Type 2 diabetes mellitus without complications: Secondary | ICD-10-CM | POA: Diagnosis not present

## 2017-07-24 DIAGNOSIS — F411 Generalized anxiety disorder: Secondary | ICD-10-CM | POA: Diagnosis not present

## 2017-07-28 DIAGNOSIS — I5033 Acute on chronic diastolic (congestive) heart failure: Secondary | ICD-10-CM | POA: Diagnosis not present

## 2017-07-28 DIAGNOSIS — D649 Anemia, unspecified: Secondary | ICD-10-CM | POA: Diagnosis not present

## 2017-07-28 DIAGNOSIS — E119 Type 2 diabetes mellitus without complications: Secondary | ICD-10-CM | POA: Diagnosis not present

## 2017-07-28 DIAGNOSIS — I110 Hypertensive heart disease with heart failure: Secondary | ICD-10-CM | POA: Diagnosis not present

## 2017-07-28 DIAGNOSIS — F411 Generalized anxiety disorder: Secondary | ICD-10-CM | POA: Diagnosis not present

## 2017-07-28 DIAGNOSIS — N390 Urinary tract infection, site not specified: Secondary | ICD-10-CM | POA: Diagnosis not present

## 2017-07-30 DIAGNOSIS — I5033 Acute on chronic diastolic (congestive) heart failure: Secondary | ICD-10-CM | POA: Diagnosis not present

## 2017-07-30 DIAGNOSIS — N390 Urinary tract infection, site not specified: Secondary | ICD-10-CM | POA: Diagnosis not present

## 2017-07-30 DIAGNOSIS — D649 Anemia, unspecified: Secondary | ICD-10-CM | POA: Diagnosis not present

## 2017-07-30 DIAGNOSIS — E119 Type 2 diabetes mellitus without complications: Secondary | ICD-10-CM | POA: Diagnosis not present

## 2017-07-30 DIAGNOSIS — F411 Generalized anxiety disorder: Secondary | ICD-10-CM | POA: Diagnosis not present

## 2017-07-30 DIAGNOSIS — I110 Hypertensive heart disease with heart failure: Secondary | ICD-10-CM | POA: Diagnosis not present

## 2017-08-03 DIAGNOSIS — Z9181 History of falling: Secondary | ICD-10-CM | POA: Diagnosis not present

## 2017-08-03 DIAGNOSIS — I10 Essential (primary) hypertension: Secondary | ICD-10-CM | POA: Diagnosis not present

## 2017-08-03 DIAGNOSIS — D539 Nutritional anemia, unspecified: Secondary | ICD-10-CM | POA: Diagnosis not present

## 2017-08-03 DIAGNOSIS — E785 Hyperlipidemia, unspecified: Secondary | ICD-10-CM | POA: Diagnosis not present

## 2017-08-03 DIAGNOSIS — Z136 Encounter for screening for cardiovascular disorders: Secondary | ICD-10-CM | POA: Diagnosis not present

## 2017-08-03 DIAGNOSIS — Z1231 Encounter for screening mammogram for malignant neoplasm of breast: Secondary | ICD-10-CM | POA: Diagnosis not present

## 2017-08-03 DIAGNOSIS — Z6827 Body mass index (BMI) 27.0-27.9, adult: Secondary | ICD-10-CM | POA: Diagnosis not present

## 2017-08-03 DIAGNOSIS — Z0000 Encounter for general adult medical examination without abnormal findings: Secondary | ICD-10-CM | POA: Diagnosis not present

## 2017-08-03 DIAGNOSIS — R635 Abnormal weight gain: Secondary | ICD-10-CM | POA: Diagnosis not present

## 2017-08-03 DIAGNOSIS — K6289 Other specified diseases of anus and rectum: Secondary | ICD-10-CM | POA: Diagnosis not present

## 2017-08-03 DIAGNOSIS — I5031 Acute diastolic (congestive) heart failure: Secondary | ICD-10-CM | POA: Diagnosis not present

## 2017-08-03 DIAGNOSIS — Z1331 Encounter for screening for depression: Secondary | ICD-10-CM | POA: Diagnosis not present

## 2017-08-04 DIAGNOSIS — I110 Hypertensive heart disease with heart failure: Secondary | ICD-10-CM | POA: Diagnosis not present

## 2017-08-04 DIAGNOSIS — F411 Generalized anxiety disorder: Secondary | ICD-10-CM | POA: Diagnosis not present

## 2017-08-04 DIAGNOSIS — E119 Type 2 diabetes mellitus without complications: Secondary | ICD-10-CM | POA: Diagnosis not present

## 2017-08-04 DIAGNOSIS — N390 Urinary tract infection, site not specified: Secondary | ICD-10-CM | POA: Diagnosis not present

## 2017-08-04 DIAGNOSIS — D649 Anemia, unspecified: Secondary | ICD-10-CM | POA: Diagnosis not present

## 2017-08-04 DIAGNOSIS — I5033 Acute on chronic diastolic (congestive) heart failure: Secondary | ICD-10-CM | POA: Diagnosis not present

## 2017-08-06 DIAGNOSIS — F419 Anxiety disorder, unspecified: Secondary | ICD-10-CM

## 2017-08-06 DIAGNOSIS — M1990 Unspecified osteoarthritis, unspecified site: Secondary | ICD-10-CM

## 2017-08-06 DIAGNOSIS — K219 Gastro-esophageal reflux disease without esophagitis: Secondary | ICD-10-CM

## 2017-08-06 DIAGNOSIS — Z1211 Encounter for screening for malignant neoplasm of colon: Secondary | ICD-10-CM | POA: Diagnosis not present

## 2017-08-06 DIAGNOSIS — K6289 Other specified diseases of anus and rectum: Secondary | ICD-10-CM | POA: Diagnosis not present

## 2017-08-06 DIAGNOSIS — E119 Type 2 diabetes mellitus without complications: Secondary | ICD-10-CM

## 2017-08-06 DIAGNOSIS — F32A Depression, unspecified: Secondary | ICD-10-CM

## 2017-08-06 DIAGNOSIS — D649 Anemia, unspecified: Secondary | ICD-10-CM

## 2017-08-06 DIAGNOSIS — R1013 Epigastric pain: Secondary | ICD-10-CM | POA: Diagnosis not present

## 2017-08-06 DIAGNOSIS — F329 Major depressive disorder, single episode, unspecified: Secondary | ICD-10-CM | POA: Insufficient documentation

## 2017-08-06 HISTORY — DX: Anxiety disorder, unspecified: F41.9

## 2017-08-06 HISTORY — DX: Depression, unspecified: F32.A

## 2017-08-06 HISTORY — DX: Type 2 diabetes mellitus without complications: E11.9

## 2017-08-06 HISTORY — DX: Unspecified osteoarthritis, unspecified site: M19.90

## 2017-08-06 HISTORY — DX: Anemia, unspecified: D64.9

## 2017-08-06 HISTORY — DX: Gastro-esophageal reflux disease without esophagitis: K21.9

## 2017-08-10 NOTE — Progress Notes (Deleted)
Cardiology Office Note:    Date:  08/10/2017   ID:  Stacy Atkinson, DOB 1940-11-20, MRN [id number]  PCP:  Lowella Dandy, NP  Cardiologist:  Shirlee More, MD    Referring MD: Lowella Dandy, NP    ASSESSMENT:    No diagnosis found. PLAN:    In order of problems listed above:  1. ***   Next appointment: ***   Medication Adjustments/Labs and Tests Ordered: Current medicines are reviewed at length with the patient today.  Concerns regarding medicines are outlined above.  No orders of the defined types were placed in this encounter.  No orders of the defined types were placed in this encounter.   No chief complaint on file.   History of Present Illness:    Stacy Atkinson is a 77 y.o. female with a hx of *** last seen ***. Compliance with diet, lifestyle and medications: *** Past Medical History:  Diagnosis Date  . Anemia 08/06/2017  . Anxiety 08/06/2017  . Arthritis 08/06/2017   Neck, shoulders, and hip  . Depression 08/06/2017  . Dyslipidemia 05/18/2017  . Essential hypertension 05/18/2017  . GERD (gastroesophageal reflux disease) 08/06/2017  . Holter monitor, abnormal 05/18/2017  . PAT (paroxysmal atrial tachycardia) ([place]) 05/18/2017  . Syncope 05/18/2017  . Type 2 diabetes mellitus ([place]) 08/06/2017    *** The histories are not reviewed yet. Please review them in the "History" navigator section and refresh this [place].  Current Medications: No outpatient medications have been marked as taking for the 08/11/17 encounter (Appointment) with Richardo Priest, MD.     Allergies:   Latex; Penicillins; and Tape   Social History   Socioeconomic History  . Marital status: Divorced    Spouse name: Not on file  . Number of children: Not on file  . Years of education: Not on file  . Highest education level: Not on file  Social Needs  . Financial resource strain: Not on file  . Food insecurity - worry: Not on file  . Food insecurity - inability: Not on file  . Transportation needs  - medical: Not on file  . Transportation needs - non-medical: Not on file  Occupational History  . Not on file  Tobacco Use  . Smoking status: Never Smoker  . Smokeless tobacco: Never Used  Substance and Sexual Activity  . Alcohol use: No    Frequency: Never  . Drug use: No  . Sexual activity: Not on file  Other Topics Concern  . Not on file  Social History Narrative  . Not on file     Family History: The patient's ***family history includes Heart attack in her father; Hypertension in her maternal aunt. ROS:   Please see the history of present illness.    All other systems reviewed and are negative.  EKGs/Labs/Other Studies Reviewed:    The following studies were reviewed today:  EKG:  EKG ordered today.  The ekg ordered today demonstrates ***  Recent Labs: No results found for requested labs within last 8760 hours.  Recent Lipid Panel No results found for: CHOL, TRIG, HDL, CHOLHDL, VLDL, LDLCALC, LDLDIRECT  Physical Exam:    VS:  There were no vitals taken for this visit.    Wt Readings from Last 3 Encounters:  No data found for Wt     GEN: *** Well nourished, well developed in no acute distress HEENT: Normal NECK: No JVD; No carotid bruits LYMPHATICS: No lymphadenopathy CARDIAC: ***RRR, no murmurs, rubs, gallops RESPIRATORY:  Clear  to auscultation without rales, wheezing or rhonchi  ABDOMEN: Soft, non-tender, non-distended MUSCULOSKELETAL:  No edema; No deformity  SKIN: Warm and dry NEUROLOGIC:  Alert and oriented x 3 PSYCHIATRIC:  Normal affect    Signed, Shirlee More, MD  08/10/2017 2:07 PM    [place]

## 2017-08-10 NOTE — Progress Notes (Signed)
Cardiology Office Note:    Date:  08/11/2017   ID:  Stacy Atkinson, DOB May 11, 1941, MRN [id number]  PCP:  Lowella Dandy, NP  Cardiologist:  Shirlee More, MD   Referring MD: Lowella Dandy, NP  ASSESSMENT:    1. Hypertensive heart disease with heart failure ([place])   2. PAT (paroxysmal atrial tachycardia) ([place])   3. Anemia, unspecified type    PLAN:    In order of problems listed above:  1. Her blood pressure is at range on current treatment heart failure is mildly decompensated and she will increase the dose of her diuretic. 2. Stable no clinical recurrence of beta-blocker 3. Awaiting colonoscopy and endoscopy, the procedure is not high risk patient stable and I would ask her to proceed on his planned unless she has such severe anemia hemoglobin less than 8 to require repeat transfusion.  Next appointment in 2 months   Medication Adjustments/Labs and Tests Ordered: Current medicines are reviewed at length with the patient today.  Concerns regarding medicines are outlined above.  Orders Placed This Encounter  Procedures  . EKG 12-Lead   No orders of the defined types were placed in this encounter.    Chief Complaint  Patient presents with  . New Patient (Initial Visit)    prior to having endoscopy    History of Present Illness:    Stacy Atkinson is a 77 y.o. female who is being seen today for the evaluation of heart disease prior to colonoscopy and endoscopy at the request of her PCP Laverna Peace NP. Is admitted to [facility] 2019 with weakness hypertensive emergency hyponatremia generalized anxiety disorder and vomiting.  Testing showed a hemoglobin of 10.4 cm sodium 132 renal function was normal troponin was undetectable normal CT of the abdomen pelvis showed renal cyst aortic atherosclerosis there are no cardiac diagnoses or specific testing performed x-ray was normal and she has a history of heart failure diastolic and is taken a loop diuretic furosemide daily She has also  been transfused in December for anemia.  She was seen by Dr Otho Perl 05/18/17 at [facility]. Mar Daring, MD - 05/18/2017 10:30 AM EST Formatting of this note may be different from the original. Cardiology New Patient Visit Reason for Visit:  Chief Complaint  Patient presents with  . New Patient  . Tachycardia   Cardiology Assessment & Plan: 1. Holter monitor, abnormal - Amb Ref to Cardiology - EKG - PERFORMED BY CLINIC STAFF 2. Essential hypertension 3. PAT (paroxysmal atrial tachycardia) (HCC)--captured on monitor 02/2017 4. Syncope, unspecified syncope type 5. Dyslipidemia PLAN: Ms. Frankowski had some atrial tachycardia captured on the monitor. The episodes were brief and she was asymptomatic with these events. She is already on a beta-blocker with the Bystolic and already on clonidine for her hypertension. I am hesitant to make any changes in her medical regimen and hesitant to add anything that may slow her heart rate further with her periods of bradycardia as well. At this point, I would recommend watching her clinically. Try to get copies of the echocardiogram performed at [facility] this summer and try to get copies of her lab work recently checked by Laverna Peace NP. I will see her back in 6-8 weeks but certainly can see her back sooner should she have recurrence of syncope or begin to experience symptomatic palpitations with the tachycardia.  Unfortunately do not have records from either her PCP or the GI consultant at the time of her office visit.  The granddaughter tells me she is scheduled for both endoscopy and colonoscopy to look for source of her anemia.  Family feels she looks very pale but tells me she has had sequential outpatient CBCs and is not had a repeat transfusion.  She has peripheral edema but her weights are stable she is sodium restricts weighs daily and uses a pillbox for her medications.  She has had no syncope palpitation orthopnea shortness of breath but  is vague chest discomfort nonanginal in nature that occurs perhaps once a week.  At this time I would would not delay her endoscopy but it future point she should undergo an ischemia evaluation.  She has had no overt bleeding.  The granddaughter tells me the procedure will be performed on the same day.  She is scheduled 09/02/2017 Past Medical History:  Diagnosis Date  . Anemia 08/06/2017  . Anxiety 08/06/2017  . Arthritis 08/06/2017   Neck, shoulders, and hip  . Depression 08/06/2017  . Dyslipidemia 05/18/2017  . Essential hypertension 05/18/2017  . GERD (gastroesophageal reflux disease) 08/06/2017  . Holter monitor, abnormal 05/18/2017  . PAT (paroxysmal atrial tachycardia) ([place]) 05/18/2017  . Syncope 05/18/2017  . Type 2 diabetes mellitus ([place]) 08/06/2017    Past Surgical History:  Procedure Laterality Date  . ABDOMINAL HYSTERECTOMY    . bladder tack    . CHOLECYSTECTOMY    . COLONOSCOPY    . CORNEAL TRANSPLANT Bilateral     Current Medications: Current Meds  Medication Sig  . acetaminophen (TYLENOL) 650 MG CR tablet Take 650 mg by mouth every 8 (eight) hours as needed for pain.  Marland Kitchen alendronate (FOSAMAX) 10 MG tablet Take 1 tablet by mouth once a week.  Marland Kitchen amLODipine (NORVASC) 10 MG tablet Take 10 mg by mouth daily.  Marland Kitchen aspirin EC 81 MG tablet Take 1 tablet by mouth daily.  . Calcium-Magnesium 500-250 MG TABS Take 1 tablet by mouth daily.  . Cholecalciferol (VITAMIN D) 2000 units CAPS Take 1 capsule by mouth daily.  . cloNIDine (CATAPRES) 0.2 MG tablet Take 0.2 mg by mouth 3 (three) times daily.  . DOCOSAHEXAENOIC ACID PO Take 1 tablet by mouth daily.  . fenofibrate (TRICOR) 145 MG tablet Take 145 mg by mouth daily.  . fluticasone (FLONASE) 50 MCG/ACT nasal spray Place 2 sprays into both nostrils daily as needed for allergies or rhinitis.  . furosemide (LASIX) 20 MG tablet Take 20 mg by mouth 2 (two) times daily.  . hydrALAZINE (APRESOLINE) 100 MG tablet Take 100 mg by mouth 3 (three)  times daily.  . isosorbide mononitrate (IMDUR) 30 MG 24 hr tablet Take 30 mg by mouth daily.  . lansoprazole (PREVACID) 30 MG capsule Take 30 mg by mouth 2 (two) times daily.  Marland Kitchen LORazepam (ATIVAN) 1 MG tablet Take 1 mg by mouth 2 (two) times daily.  . metFORMIN (GLUCOPHAGE) 500 MG tablet Take 500 mg by mouth daily.  . montelukast (SINGULAIR) 10 MG tablet Take 10 mg by mouth daily.  . Multiple Vitamins-Minerals (VISION FORMULA PO) Take 1 tablet by mouth daily.  . nebivolol (BYSTOLIC) 5 MG tablet Take 5 mg by mouth daily.  . ondansetron (ZOFRAN-ODT) 4 MG disintegrating tablet Take 4 mg by mouth every 6 (six) hours as needed for nausea/vomiting.  . potassium chloride SA (K-DUR,KLOR-CON) 20 MEQ tablet Take 20 mEq by mouth daily.  . QUEtiapine (SEROQUEL) 50 MG tablet Take 50 mg by mouth daily.  . rosuvastatin (CRESTOR) 10 MG tablet Take 10 mg by mouth daily.  Marland Kitchen  telmisartan (MICARDIS) 80 MG tablet Take 80 mg by mouth daily.  . traMADol (ULTRAM) 50 MG tablet Take 50 mg by mouth every 4 (four) hours as needed.  . traMADol-acetaminophen (ULTRACET) 37.5-325 MG tablet Take 1 tablet by mouth every 6 (six) hours as needed.  . Zinc 50 MG TABS Take 1 tablet by mouth daily.     Allergies:   Latex; Penicillins; and Tape   Social History   Socioeconomic History  . Marital status: Divorced    Spouse name: None  . Number of children: None  . Years of education: None  . Highest education level: None  Social Needs  . Financial resource strain: None  . Food insecurity - worry: None  . Food insecurity - inability: None  . Transportation needs - medical: None  . Transportation needs - non-medical: None  Occupational History  . None  Tobacco Use  . Smoking status: Never Smoker  . Smokeless tobacco: Never Used  Substance and Sexual Activity  . Alcohol use: No    Frequency: Never  . Drug use: No  . Sexual activity: None  Other Topics Concern  . None  Social History Narrative  . None     Family  History: The patient's family history includes Breast cancer in her other; Heart attack in her father and mother; Hypertension in her maternal aunt.  ROS:   Review of Systems  Constitution: Negative.  HENT: Negative.   Eyes: Negative.   Cardiovascular: Positive for chest pain.  Endocrine: Negative.   Hematologic/Lymphatic: Negative.   Skin: Positive for color change (pallor).  Musculoskeletal: Negative.   Gastrointestinal: Negative.   Genitourinary: Negative.   Neurological: Negative.   Psychiatric/Behavioral: Negative.   Allergic/Immunologic: Negative.    Please see the history of present illness.     All other systems reviewed and are negative.  EKGs/Labs/Other Studies Reviewed:    The following studies were reviewed today: EKG today sinus rhythm somewhat peaked T waves but within normal limits  Echo TEE: EF 65-70%, LAE mild MR  Holter monitor 03/06/17: Rhythm Findings: 1.Ventricular: Rare isolated PVC's (217 in 48 hours 2.Supraventricular: Occasional PAC's (778 in 48 hours, 24 runs of atrial tachycardia, longest 21 beats at 112 bpm, fastest 145 bpm lasting 20 beats). 3.Bradyarrhythmias and Pauses: No significant bradycardia was captured.The slowest heart rate capture was 45 bpm at 4:39 AM.There were 2 episodes of bradycardia with heart rate 50 bpm at 4:40 AM and another spell at 8:59 AM Symptoms reported:No diary was returned with the study CONCLUSIONS: 1.Abnormal Holter monitor.  2.Arrhythmias as described 3.Symptoms were not reported   Recent Labs: No results found for requested labs within last 8760 hours.  Recent Lipid Panel No results found for: CHOL, TRIG, HDL, CHOLHDL, VLDL, LDLCALC, LDLDIRECT  Physical Exam:    VS:  BP 136/74 (BP Location: Left Arm, Patient Position: Sitting, Cuff Size: Normal)   Pulse 60   Ht 4\' 10"  (1.473 m)   Wt 135 lb 1.9 oz (61.3 kg)   SpO2 98%   BMI 28.24 kg/m     Wt Readings from Last 3 Encounters:  08/11/17  135 lb 1.9 oz (61.3 kg)     GEN: Pallor of skin and membranes looks frail   in no acute distress HEENT: Normal NECK: No JVD; No carotid bruits LYMPHATICS: No lymphadenopathy CARDIAC: RRR, no murmurs, rubs, gallops RESPIRATORY:  Clear to auscultation without rales, wheezing or rhonchi  ABDOMEN: Soft, non-tender, non-distended MUSCULOSKELETAL:  Mild  edema; No deformity  SKIN:  Warm and dry NEUROLOGIC:  Alert and oriented x 3 PSYCHIATRIC:  Normal affect     Signed, Shirlee More, MD  08/11/2017 12:33 PM    [facility]

## 2017-08-11 ENCOUNTER — Encounter: Payer: Self-pay | Admitting: Cardiology

## 2017-08-11 ENCOUNTER — Ambulatory Visit (INDEPENDENT_AMBULATORY_CARE_PROVIDER_SITE_OTHER): Payer: Medicare Other | Admitting: Cardiology

## 2017-08-11 VITALS — BP 136/74 | HR 60 | Ht <= 58 in | Wt 135.1 lb

## 2017-08-11 DIAGNOSIS — I110 Hypertensive heart disease with heart failure: Secondary | ICD-10-CM | POA: Diagnosis not present

## 2017-08-11 DIAGNOSIS — D649 Anemia, unspecified: Secondary | ICD-10-CM | POA: Diagnosis not present

## 2017-08-11 DIAGNOSIS — I471 Supraventricular tachycardia: Secondary | ICD-10-CM | POA: Diagnosis not present

## 2017-08-11 NOTE — Patient Instructions (Addendum)
Medication Instructions:  Your physician has recommended you make the following change in your medication:  INCREASE furosemide to 20 mg twice daily  Labwork: None  Testing/Procedures: You had an EKG today.  Follow-Up: Your physician recommends that you schedule a follow-up appointment in: 2 months.  Any Other Special Instructions Will Be Listed Below (If Applicable).     If you need a refill on your cardiac medications before your next appointment, please call your pharmacy.    Heart Failure  Weigh yourself every morning when you first wake up and record on a calender or note pad, bring this to your office visits. Using a pill tender can help with taking your medications consistently.  Limit your fluid intake to 2 liters daily  Limit your sodium intake to less than 2-3 grams daily. Ask if you need dietary teaching.  If you gain more than 3 pounds (from your dry weight ), double your dose of diuretic for the day.  If you gain more than 5 pounds (from your dry weight), double your dose of lasix and call your heart failure doctor.  Please do not smoke tobacco since it is very bad for your heart.  Please do not drink alcohol since it can worsen your heart failure.Also avoid OTC nonsteroidal drugs, such as advil, aleve and motrin.  Try to exercise for at least 30 minutes every day because this will help your heart be more efficient. You may be eligible for supervised cardiac rehab, ask your physician.

## 2017-08-26 DIAGNOSIS — F200 Paranoid schizophrenia: Secondary | ICD-10-CM | POA: Diagnosis not present

## 2017-08-26 DIAGNOSIS — Z6827 Body mass index (BMI) 27.0-27.9, adult: Secondary | ICD-10-CM | POA: Diagnosis not present

## 2017-08-26 DIAGNOSIS — I5031 Acute diastolic (congestive) heart failure: Secondary | ICD-10-CM | POA: Diagnosis not present

## 2017-08-26 DIAGNOSIS — I10 Essential (primary) hypertension: Secondary | ICD-10-CM | POA: Diagnosis not present

## 2017-08-26 DIAGNOSIS — J309 Allergic rhinitis, unspecified: Secondary | ICD-10-CM | POA: Diagnosis not present

## 2017-08-26 DIAGNOSIS — D539 Nutritional anemia, unspecified: Secondary | ICD-10-CM | POA: Diagnosis not present

## 2017-09-02 DIAGNOSIS — K635 Polyp of colon: Secondary | ICD-10-CM | POA: Diagnosis not present

## 2017-09-02 DIAGNOSIS — K2970 Gastritis, unspecified, without bleeding: Secondary | ICD-10-CM | POA: Diagnosis not present

## 2017-09-02 DIAGNOSIS — D120 Benign neoplasm of cecum: Secondary | ICD-10-CM | POA: Diagnosis not present

## 2017-09-02 DIAGNOSIS — Z1211 Encounter for screening for malignant neoplasm of colon: Secondary | ICD-10-CM | POA: Diagnosis not present

## 2017-09-02 DIAGNOSIS — K5730 Diverticulosis of large intestine without perforation or abscess without bleeding: Secondary | ICD-10-CM | POA: Diagnosis not present

## 2017-09-02 DIAGNOSIS — K2950 Unspecified chronic gastritis without bleeding: Secondary | ICD-10-CM | POA: Diagnosis not present

## 2017-09-02 DIAGNOSIS — R1013 Epigastric pain: Secondary | ICD-10-CM | POA: Diagnosis not present

## 2017-09-02 DIAGNOSIS — Z86010 Personal history of colonic polyps: Secondary | ICD-10-CM | POA: Diagnosis not present

## 2017-09-02 DIAGNOSIS — B9681 Helicobacter pylori [H. pylori] as the cause of diseases classified elsewhere: Secondary | ICD-10-CM | POA: Diagnosis not present

## 2017-09-15 DIAGNOSIS — F209 Schizophrenia, unspecified: Secondary | ICD-10-CM | POA: Diagnosis not present

## 2017-09-23 DIAGNOSIS — E785 Hyperlipidemia, unspecified: Secondary | ICD-10-CM | POA: Diagnosis not present

## 2017-09-23 DIAGNOSIS — I10 Essential (primary) hypertension: Secondary | ICD-10-CM | POA: Diagnosis not present

## 2017-09-23 DIAGNOSIS — F200 Paranoid schizophrenia: Secondary | ICD-10-CM | POA: Diagnosis not present

## 2017-09-23 DIAGNOSIS — Z6827 Body mass index (BMI) 27.0-27.9, adult: Secondary | ICD-10-CM | POA: Diagnosis not present

## 2017-09-23 DIAGNOSIS — I5031 Acute diastolic (congestive) heart failure: Secondary | ICD-10-CM | POA: Diagnosis not present

## 2017-09-23 DIAGNOSIS — R7309 Other abnormal glucose: Secondary | ICD-10-CM | POA: Diagnosis not present

## 2017-09-23 DIAGNOSIS — D539 Nutritional anemia, unspecified: Secondary | ICD-10-CM | POA: Diagnosis not present

## 2017-10-12 NOTE — Progress Notes (Signed)
Cardiology Office Note:    Date:  10/13/2017   ID:  Stacy Atkinson, DOB [birth year], MRN [id number]  PCP:  Lowella Dandy, NP  Cardiologist:  Shirlee More, MD    Referring MD: Lowella Dandy, NP     ASSESSMENT:    1. Hypertensive heart disease with heart failure ([place])   2. Localized edema   3. PAT (paroxysmal atrial tachycardia) (HCC)    PLAN:    In order of problems listed above:  1. She has very severe hypertension is intolerant of beta-blockers with symptomatic bradycardia and syncope has peripheral edema we will stop her calcium channel blocker and given a prescription for an alpha-blocker to initiate for systolics greater than 379.  Heart failure is compensated continue her current loop diuretic continue her multidrug regimen including high-dose hydralazine clonidine and new introduction of alpha blocker. 2. Clinically due to her calcium channel blocker to be discontinued she will monitor blood pressure at home and start alpha-blocker with systolic greater than 024 3. Stable no clinical recurrence   Next appointment: 6 weeks   Medication Adjustments/Labs and Tests Ordered: Current medicines are reviewed at length with the patient today.  Concerns regarding medicines are outlined above.  No orders of the defined types were placed in this encounter.  Meds ordered this encounter  Medications  . doxazosin (CARDURA) 2 MG tablet    Sig: Take 1 tablet (2 mg total) by mouth daily.    Dispense:  30 tablet    Refill:  11    Chief Complaint  Patient presents with  . Follow-up    2 month follow up   . Leg Swelling  . Congestive Heart Failure  . Anemia    History of Present Illness:    Stacy Atkinson is a 77 y.o. female with a hx of hypertension and PAT last seen 2 months ago.  ASSESSMENT:    08/11/17   1. Hypertensive heart disease with heart failure ([place])   2. PAT (paroxysmal atrial tachycardia) ([place])   3. Anemia, unspecified type    PLAN:    In order of problems listed  above:  1.   Her blood pressure is at range on current treatment heart failure is mildly decompensated and she will increase the dose of her diuretic. 2.    Stable no clinical recurrence of beta-blocker 3.    Awaiting colonoscopy and endoscopy, the procedure is not high risk patient stable and I would ask her to proceed on his planned unless she has such severe anemia hemoglobin less than 8 to require repeat transfusion.  Compliance with diet, lifestyle and medications: Yes She underwent endoscopy colonoscopy and has no bleeding point.  Last hemoglobin was greater than 11.  She tells me her diuretic dose was decreased because of renal dysfunction labs requested from her PCP and has arrangements for follow-up labs next month.  She continues to have edema on calcium channel blocker discontinued.  No shortness of breath orthopnea chest pain palpitation or syncope but complains of dry mouth and malaise likely related to high-dose clonidine. Past Medical History:  Diagnosis Date  . Anemia 08/06/2017  . Anxiety 08/06/2017  . Arthritis 08/06/2017   Neck, shoulders, and hip  . Depression 08/06/2017  . Dyslipidemia 05/18/2017  . Essential hypertension 05/18/2017  . GERD (gastroesophageal reflux disease) 08/06/2017  . Holter monitor, abnormal 05/18/2017  . PAT (paroxysmal atrial tachycardia) ([place]) 05/18/2017  . Syncope 05/18/2017  . Type 2 diabetes mellitus ([place]) 08/06/2017    Past  Surgical History:  Procedure Laterality Date  . ABDOMINAL HYSTERECTOMY    . bladder tack    . CHOLECYSTECTOMY    . COLONOSCOPY    . CORNEAL TRANSPLANT Bilateral     Current Medications: Current Meds  Medication Sig  . acetaminophen (TYLENOL) 650 MG CR tablet Take 650 mg by mouth every 8 (eight) hours as needed for pain.  Marland Kitchen alendronate (FOSAMAX) 10 MG tablet Take 1 tablet by mouth once a week.  Marland Kitchen aspirin EC 81 MG tablet Take 1 tablet by mouth daily.  . Calcium-Magnesium 500-250 MG TABS Take 1 tablet by mouth daily.  .  Cholecalciferol (VITAMIN D) 2000 units CAPS Take 1 capsule by mouth daily.  . cloNIDine (CATAPRES) 0.2 MG tablet Take 0.2 mg by mouth 3 (three) times daily.  . DOCOSAHEXAENOIC ACID PO Take 1 tablet by mouth daily.  . fenofibrate (TRICOR) 145 MG tablet Take 145 mg by mouth daily.  . fluticasone (FLONASE) 50 MCG/ACT nasal spray Place 2 sprays into both nostrils daily as needed for allergies or rhinitis.  . furosemide (LASIX) 20 MG tablet Take 20 mg by mouth daily.   . hydrALAZINE (APRESOLINE) 100 MG tablet Take 100 mg by mouth 3 (three) times daily.  . isosorbide mononitrate (IMDUR) 30 MG 24 hr tablet Take 30 mg by mouth daily.  . lansoprazole (PREVACID) 30 MG capsule Take 30 mg by mouth 2 (two) times daily.  Marland Kitchen LORazepam (ATIVAN) 1 MG tablet Take 1 mg by mouth 2 (two) times daily.  . metFORMIN (GLUCOPHAGE) 500 MG tablet Take 500 mg by mouth daily.  . montelukast (SINGULAIR) 10 MG tablet Take 10 mg by mouth daily.  . Multiple Vitamins-Minerals (VISION FORMULA PO) Take 1 tablet by mouth daily.  . nebivolol (BYSTOLIC) 5 MG tablet Take 5 mg by mouth daily.  . ondansetron (ZOFRAN-ODT) 4 MG disintegrating tablet Take 4 mg by mouth every 6 (six) hours as needed for nausea/vomiting.  . potassium chloride SA (K-DUR,KLOR-CON) 20 MEQ tablet Take 20 mEq by mouth daily.  . QUEtiapine (SEROQUEL) 50 MG tablet Take 50 mg by mouth daily.  . rosuvastatin (CRESTOR) 10 MG tablet Take 10 mg by mouth daily.  Marland Kitchen telmisartan (MICARDIS) 80 MG tablet Take 80 mg by mouth daily.  . traMADol (ULTRAM) 50 MG tablet Take 50 mg by mouth every 4 (four) hours as needed.  . traMADol-acetaminophen (ULTRACET) 37.5-325 MG tablet Take 1 tablet by mouth every 6 (six) hours as needed.  . Zinc 50 MG TABS Take 1 tablet by mouth daily.  . [DISCONTINUED] amLODipine (NORVASC) 10 MG tablet Take 10 mg by mouth daily.     Allergies:   Latex; Penicillins; and Tape   Social History   Socioeconomic History  . Marital status: Divorced     Spouse name: Not on file  . Number of children: Not on file  . Years of education: Not on file  . Highest education level: Not on file  Occupational History  . Not on file  Social Needs  . Financial resource strain: Not on file  . Food insecurity:    Worry: Not on file    Inability: Not on file  . Transportation needs:    Medical: Not on file    Non-medical: Not on file  Tobacco Use  . Smoking status: Never Smoker  . Smokeless tobacco: Never Used  Substance and Sexual Activity  . Alcohol use: No    Frequency: Never  . Drug use: No  . Sexual activity: Not  on file  Lifestyle  . Physical activity:    Days per week: Not on file    Minutes per session: Not on file  . Stress: Not on file  Relationships  . Social connections:    Talks on phone: Not on file    Gets together: Not on file    Attends religious service: Not on file    Active member of club or organization: Not on file    Attends meetings of clubs or organizations: Not on file    Relationship status: Not on file  Other Topics Concern  . Not on file  Social History Narrative  . Not on file     Family History: The patient's family history includes Breast cancer in her other; Heart attack in her father and mother; Hypertension in her maternal aunt. ROS:   Please see the history of present illness.    All other systems reviewed and are negative.  EKGs/Labs/Other Studies Reviewed:    The following studies were reviewed today  Recent Labs: No results found for requested labs within last 8760 hours.  Recent Lipid Panel No results found for: CHOL, TRIG, HDL, CHOLHDL, VLDL, LDLCALC, LDLDIRECT  Physical Exam:    VS:  BP (!) 142/74 (BP Location: Left Arm, Patient Position: Sitting, Cuff Size: Normal)   Pulse 70   Ht 4\' 10"  (1.473 m)   Wt 142 lb (64.4 kg)   SpO2 98%   BMI 29.68 kg/m     Wt Readings from Last 3 Encounters:  10/13/17 142 lb (64.4 kg)  08/11/17 135 lb 1.9 oz (61.3 kg)     GEN:  Well  nourished, well developed in no acute distress HEENT: Normal NECK: No JVD; No carotid bruits LYMPHATICS: No lymphadenopathy CARDIAC: \RRR, no murmurs, rubs, gallops RESPIRATORY:  Clear to auscultation without rales, wheezing or rhonchi  ABDOMEN: Soft, non-tender, non-distended MUSCULOSKELETAL: 1-2+ nonpitting edema with venous varicosities bilateral edema; No deformity  SKIN: Warm and dry NEUROLOGIC:  Alert and oriented x 3 PSYCHIATRIC:  Normal affect    Signed, Shirlee More, MD  10/13/2017 11:38 AM    [place]

## 2017-10-13 ENCOUNTER — Encounter: Payer: Self-pay | Admitting: Cardiology

## 2017-10-13 ENCOUNTER — Ambulatory Visit (INDEPENDENT_AMBULATORY_CARE_PROVIDER_SITE_OTHER): Payer: Medicare Other | Admitting: Cardiology

## 2017-10-13 VITALS — BP 142/74 | HR 70 | Ht <= 58 in | Wt 142.0 lb

## 2017-10-13 DIAGNOSIS — I4719 Other supraventricular tachycardia: Secondary | ICD-10-CM

## 2017-10-13 DIAGNOSIS — I471 Supraventricular tachycardia: Secondary | ICD-10-CM | POA: Diagnosis not present

## 2017-10-13 DIAGNOSIS — I110 Hypertensive heart disease with heart failure: Secondary | ICD-10-CM

## 2017-10-13 DIAGNOSIS — R600 Localized edema: Secondary | ICD-10-CM | POA: Diagnosis not present

## 2017-10-13 MED ORDER — DOXAZOSIN MESYLATE 2 MG PO TABS: 2.0000 mg | ORAL_TABLET | Freq: Every day | ORAL | 11 refills | Status: DC

## 2017-10-13 NOTE — Patient Instructions (Signed)
Medication Instructions:  Your physician has recommended you make the following change in your medication:  STOP amlodipine START doxazosin (Cardura) 2 mg daily in the evening. Be careful getting out of bed when starting. Start when your blood pressure gets above 150.  Labwork: None  Testing/Procedures: None  Follow-Up: Your physician recommends that you schedule a follow-up appointment in: 6 weeks.  Any Other Special Instructions Will Be Listed Below (If Applicable).     If you need a refill on your cardiac medications before your next appointment, please call your pharmacy.

## 2017-10-14 DIAGNOSIS — F209 Schizophrenia, unspecified: Secondary | ICD-10-CM | POA: Diagnosis not present

## 2017-11-10 DIAGNOSIS — Z1231 Encounter for screening mammogram for malignant neoplasm of breast: Secondary | ICD-10-CM | POA: Diagnosis not present

## 2017-11-13 IMAGING — XA Imaging study
2 series · 2 of 2 positions shown · non-contrast
Comparison: none

CLINICAL DATA: Lumbosacral spondylosis without myelopathy.
Left-sided low back and left thigh pain. 60% improvement following
the L4-5 epidural injection last month. Repeat injection requested.

[Series 1: ortho standard · 1 of 1 slices shown (1 of 2)]
[im 1/1]
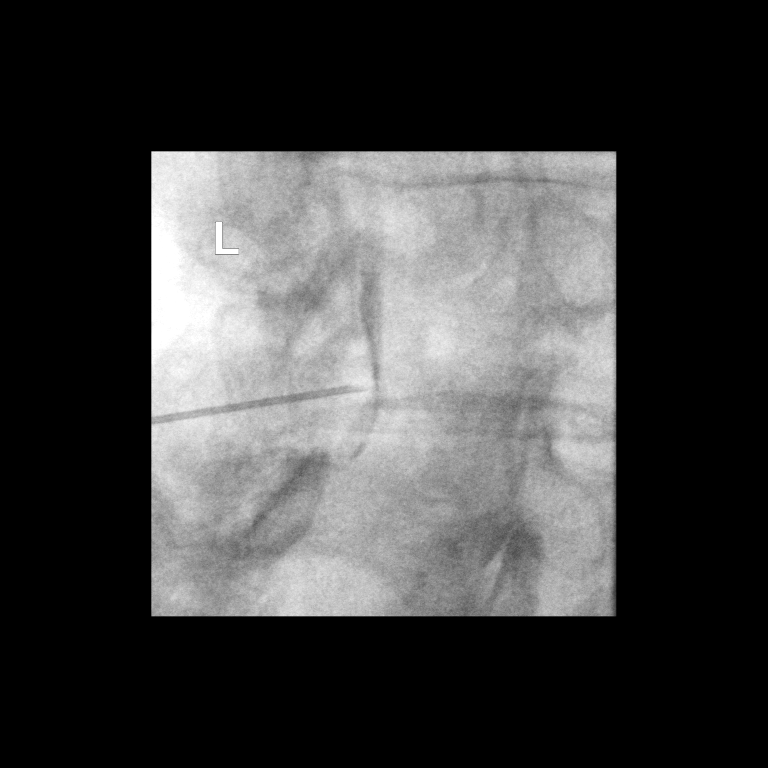

[Series 3: ortho standard · 1 of 1 slices shown (2 of 2)]
[im 1/1]
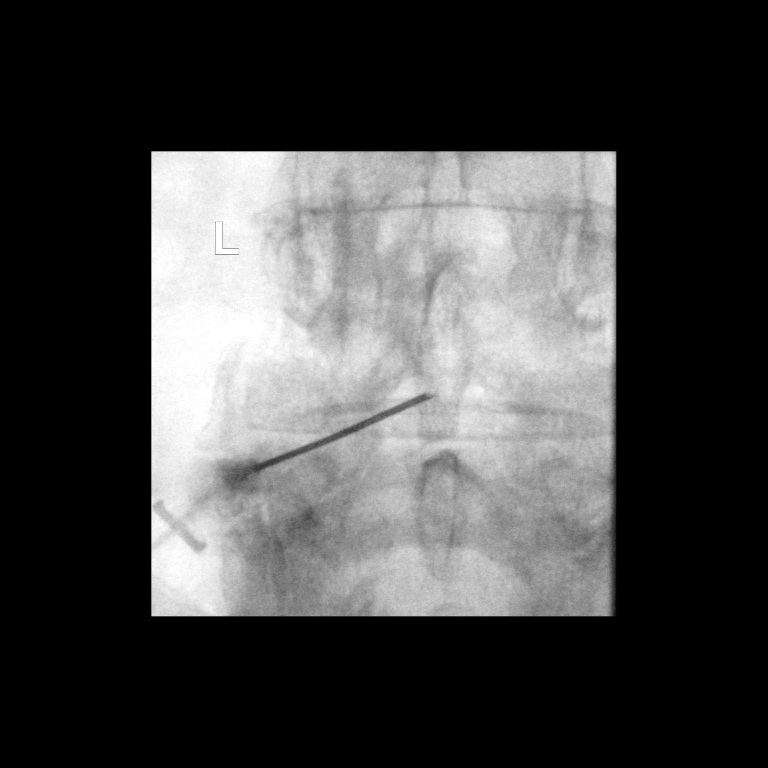

[2 of 2 positions shown; findings below may reference images not displayed]

FLUOROSCOPY TIME:  Radiation Exposure Index (as provided by the
fluoroscopic device): 12.59 microGray*m^2

Fluoroscopy Time (in minutes and seconds):  9 seconds

PROCEDURE:
The procedure, risks, benefits, and alternatives were explained to
the patient. Questions regarding the procedure were encouraged and
answered. The patient understands and consents to the procedure.

LUMBAR EPIDURAL INJECTION:

An interlaminar approach was performed on the left at L4-5. The
overlying skin was cleansed and anesthetized. A 3.5 inch 20 gauge
epidural needle was advanced using loss-of-resistance technique.

DIAGNOSTIC EPIDURAL INJECTION:

Injection of Isovue-M 200 shows a good epidural pattern with spread
above and below the level of needle placement, primarily on the
left. No vascular opacification is seen.

THERAPEUTIC EPIDURAL INJECTION:

120 mg of Depo-Medrol mixed with 3 mL of 1% lidocaine were
instilled. The procedure was well-tolerated, and the patient was
discharged thirty minutes following the injection in good condition.

COMPLICATIONS:
None
IMPRESSION: Technically successful second lumbar interlaminar epidural injection
on the left at L4-5.

## 2017-11-16 DIAGNOSIS — J069 Acute upper respiratory infection, unspecified: Secondary | ICD-10-CM | POA: Diagnosis not present

## 2017-11-16 DIAGNOSIS — J309 Allergic rhinitis, unspecified: Secondary | ICD-10-CM | POA: Diagnosis not present

## 2017-11-24 NOTE — Progress Notes (Signed)
Cardiology Office Note:    Date:  11/25/2017   ID:  Stacy Atkinson, DOB 29-Dec-1940, MRN [id number]  PCP:  Stacy Dandy, NP  Cardiologist:  Stacy More, MD    Referring MD: Stacy Dandy, NP    ASSESSMENT:    1. Hypertensive heart disease with heart failure ([place])   2. PAT (paroxysmal atrial tachycardia) (HCC)   3. Edema of both legs    PLAN:    In order of problems listed above:  1. Overall stable, I suspect this morning his blood pressure is a consequence of taking all her medications at one time plan as outlined.  Goal systolic less than 1 06-3 40 2. Stable controlled with and continue her beta-blocker  3. Improved due to heart failure continue her current diuretic   Next appointment: 3 months     Medication Adjustments/Labs and Tests Ordered: Current medicines are reviewed at length with the patient today.  Concerns regarding medicines are outlined above.  No orders of the defined types were placed in this encounter.  No orders of the defined types were placed in this encounter.   Chief Complaint  Patient presents with  . Follow-up  . Hypertension    and PAT  . Congestive Heart Failure    History of Present Illness:    Stacy Atkinson is a 77 y.o. female with a hx of hypertension and PAT last seen 10/13/17.  ASSESSMENT:    10/13/17   1. Hypertensive heart disease with heart failure ([place])   2. Localized edema   3. PAT (paroxysmal atrial tachycardia) (HCC)    PLAN:    1. She has very severe hypertension is intolerant of beta-blockers with symptomatic bradycardia and syncope has peripheral edema we will stop her calcium channel blocker and given a prescription for an alpha-blocker to initiate for systolics greater than 016.  Heart failure is compensated continue her current loop diuretic continue her multidrug regimen including high-dose hydralazine clonidine and new introduction of alpha blocker. 2. Clinically due to her calcium channel blocker to be discontinued  she will monitor blood pressure at home and start alpha-blocker with systolic greater than 010 3. Stable no clinical recurrence  Compliance with diet, lifestyle and medications: Yes  Overall she is done better tells me her home blood pressure tends to run 9/32/3557 systolic if it is low in the morning she weighs to later in the day and spaces on her medications.  Unfortunately rushing to come here today she did not check her blood pressure of multiple antihypertensives at one time and is asymptomatic hypotension my office today.  She had already decreased her alpha-blocker by 50% and I asked her to stop she is going to take a holiday for the rest of the day without any further antihypertensive medications and if her home morning systolic is less than 322 she will omit the morning dose of hydralazine.  Her weight is stable no edema shortness of breath chest pain palpitation or syncope. Past Medical History:  Diagnosis Date  . Anemia 08/06/2017  . Anxiety 08/06/2017  . Arthritis 08/06/2017   Neck, shoulders, and hip  . Depression 08/06/2017  . Dyslipidemia 05/18/2017  . Essential hypertension 05/18/2017  . GERD (gastroesophageal reflux disease) 08/06/2017  . Holter monitor, abnormal 05/18/2017  . PAT (paroxysmal atrial tachycardia) ([place]) 05/18/2017  . Syncope 05/18/2017  . Type 2 diabetes mellitus ([place]) 08/06/2017    Past Surgical History:  Procedure Laterality Date  . ABDOMINAL HYSTERECTOMY    . bladder  tack    . CHOLECYSTECTOMY    . COLONOSCOPY    . CORNEAL TRANSPLANT Bilateral     Current Medications: Current Meds  Medication Sig  . acetaminophen (TYLENOL) 650 MG CR tablet Take 650 mg by mouth every 8 (eight) hours as needed for pain.  Marland Kitchen alendronate (FOSAMAX) 10 MG tablet Take 1 tablet by mouth once a week.  Marland Kitchen aspirin EC 81 MG tablet Take 1 tablet by mouth daily.  . Calcium-Magnesium 500-250 MG TABS Take 1 tablet by mouth daily.  . Cholecalciferol (VITAMIN D) 2000 units CAPS Take 1 capsule  by mouth daily.  . cloNIDine (CATAPRES) 0.2 MG tablet Take 0.2 mg by mouth 3 (three) times daily.  . DOCOSAHEXAENOIC ACID PO Take 1 tablet by mouth daily.  . fenofibrate (TRICOR) 145 MG tablet Take 145 mg by mouth daily.  . fluticasone (FLONASE) 50 MCG/ACT nasal spray Place 2 sprays into both nostrils daily as needed for allergies or rhinitis.  . furosemide (LASIX) 20 MG tablet Take 20 mg by mouth daily.   . hydrALAZINE (APRESOLINE) 100 MG tablet Take 100 mg by mouth 3 (three) times daily.  . isosorbide mononitrate (IMDUR) 30 MG 24 hr tablet Take 30 mg by mouth daily.  . lansoprazole (PREVACID) 30 MG capsule Take 30 mg by mouth 2 (two) times daily.  Marland Kitchen LORazepam (ATIVAN) 1 MG tablet Take 1 mg by mouth 2 (two) times daily.  . metFORMIN (GLUCOPHAGE) 500 MG tablet Take 500 mg by mouth daily.  . montelukast (SINGULAIR) 10 MG tablet Take 10 mg by mouth daily.  . Multiple Vitamins-Minerals (VISION FORMULA PO) Take 1 tablet by mouth daily.  . nebivolol (BYSTOLIC) 5 MG tablet Take 5 mg by mouth daily.  . ondansetron (ZOFRAN-ODT) 4 MG disintegrating tablet Take 4 mg by mouth every 6 (six) hours as needed for nausea/vomiting.  . potassium chloride SA (K-DUR,KLOR-CON) 20 MEQ tablet Take 20 mEq by mouth daily.  . QUEtiapine (SEROQUEL) 25 MG tablet Take 25 mg by mouth daily.   . rosuvastatin (CRESTOR) 10 MG tablet Take 10 mg by mouth daily.  Marland Kitchen telmisartan (MICARDIS) 80 MG tablet Take 80 mg by mouth daily.  . traMADol (ULTRAM) 50 MG tablet Take 50 mg by mouth every 4 (four) hours as needed.  . traMADol-acetaminophen (ULTRACET) 37.5-325 MG tablet Take 1 tablet by mouth every 6 (six) hours as needed.  . Zinc 50 MG TABS Take 1 tablet by mouth daily.  . [DISCONTINUED] doxazosin (CARDURA) 2 MG tablet Take 1 tablet (2 mg total) by mouth daily. (Patient taking differently: Take 1 mg by mouth daily. )     Allergies:   Latex; Penicillins; and Tape   Social History   Socioeconomic History  . Marital status:  Divorced    Spouse name: Not on file  . Number of children: Not on file  . Years of education: Not on file  . Highest education level: Not on file  Occupational History  . Not on file  Social Needs  . Financial resource strain: Not on file  . Food insecurity:    Worry: Not on file    Inability: Not on file  . Transportation needs:    Medical: Not on file    Non-medical: Not on file  Tobacco Use  . Smoking status: Never Smoker  . Smokeless tobacco: Never Used  Substance and Sexual Activity  . Alcohol use: No    Frequency: Never  . Drug use: No  . Sexual activity: Not on  file  Lifestyle  . Physical activity:    Days per week: Not on file    Minutes per session: Not on file  . Stress: Not on file  Relationships  . Social connections:    Talks on phone: Not on file    Gets together: Not on file    Attends religious service: Not on file    Active member of club or organization: Not on file    Attends meetings of clubs or organizations: Not on file    Relationship status: Not on file  Other Topics Concern  . Not on file  Social History Narrative  . Not on file     Family History: The patient's family history includes Breast cancer in her other; Heart attack in her father and mother; Hypertension in her maternal aunt. ROS:   Please see the history of present illness.    All other systems reviewed and are negative.  EKGs/Labs/Other Studies Reviewed:    The following studies were reviewed today:   Recent Labs: Requested from her PCP office No results found for requested labs within last 8760 hours.  Recent Lipid Panel No results found for: CHOL, TRIG, HDL, CHOLHDL, VLDL, LDLCALC, LDLDIRECT  Physical Exam:    VS:  BP (!) 90/48 (BP Location: Left Arm, Patient Position: Sitting, Cuff Size: Normal)   Pulse (!) 59   Ht 4\' 10"  (1.473 m)   Wt 145 lb 1.9 oz (65.8 kg)   SpO2 98%   BMI 30.33 kg/m     Wt Readings from Last 3 Encounters:  11/25/17 145 lb 1.9 oz (65.8  kg)  10/13/17 142 lb (64.4 kg)  08/11/17 135 lb 1.9 oz (61.3 kg)     GEN:  Well nourished, well developed in no acute distress HEENT: Normal NECK: No JVD; No carotid bruits LYMPHATICS: No lymphadenopathy CARDIAC: RRR, no murmurs, rubs, gallops RESPIRATORY:  Clear to auscultation without rales, wheezing or rhonchi  ABDOMEN: Soft, non-tender, non-distended MUSCULOSKELETAL:  No edema; No deformity  SKIN: Warm and dry NEUROLOGIC:  Alert and oriented x 3 PSYCHIATRIC:  Normal affect    Signed, Stacy More, MD  11/25/2017 11:40 AM    [place]

## 2017-11-25 ENCOUNTER — Ambulatory Visit (INDEPENDENT_AMBULATORY_CARE_PROVIDER_SITE_OTHER): Payer: Medicare Other | Admitting: Cardiology

## 2017-11-25 ENCOUNTER — Encounter: Payer: Self-pay | Admitting: Cardiology

## 2017-11-25 VITALS — BP 90/48 | HR 59 | Ht <= 58 in | Wt 145.1 lb

## 2017-11-25 DIAGNOSIS — R600 Localized edema: Secondary | ICD-10-CM | POA: Diagnosis not present

## 2017-11-25 DIAGNOSIS — I471 Supraventricular tachycardia: Secondary | ICD-10-CM | POA: Diagnosis not present

## 2017-11-25 DIAGNOSIS — I110 Hypertensive heart disease with heart failure: Secondary | ICD-10-CM | POA: Diagnosis not present

## 2017-11-25 NOTE — Patient Instructions (Signed)
Medication Instructions:  Your physician has recommended you make the following change in your medication:  STOP cardura  Do not take any additional medications today.  If systolic blood pressure (top number) less than 110, do not take morning dose of hydralazine.  Labwork: None  Testing/Procedures: None  Follow-Up: Your physician recommends that you schedule a follow-up appointment in: 3 months.  Any Other Special Instructions Will Be Listed Below (If Applicable).     If you need a refill on your cardiac medications before your next appointment, please call your pharmacy.

## 2017-11-26 DIAGNOSIS — J309 Allergic rhinitis, unspecified: Secondary | ICD-10-CM | POA: Diagnosis not present

## 2017-11-26 DIAGNOSIS — J069 Acute upper respiratory infection, unspecified: Secondary | ICD-10-CM | POA: Diagnosis not present

## 2017-12-16 DIAGNOSIS — F209 Schizophrenia, unspecified: Secondary | ICD-10-CM | POA: Diagnosis not present

## 2017-12-25 DIAGNOSIS — J209 Acute bronchitis, unspecified: Secondary | ICD-10-CM | POA: Diagnosis not present

## 2018-01-07 DIAGNOSIS — D539 Nutritional anemia, unspecified: Secondary | ICD-10-CM | POA: Diagnosis not present

## 2018-01-07 DIAGNOSIS — I10 Essential (primary) hypertension: Secondary | ICD-10-CM | POA: Diagnosis not present

## 2018-01-07 DIAGNOSIS — R7309 Other abnormal glucose: Secondary | ICD-10-CM | POA: Diagnosis not present

## 2018-01-07 DIAGNOSIS — E785 Hyperlipidemia, unspecified: Secondary | ICD-10-CM | POA: Diagnosis not present

## 2018-01-07 DIAGNOSIS — E871 Hypo-osmolality and hyponatremia: Secondary | ICD-10-CM | POA: Diagnosis not present

## 2018-01-28 DIAGNOSIS — H2703 Aphakia, bilateral: Secondary | ICD-10-CM | POA: Diagnosis not present

## 2018-02-10 DIAGNOSIS — F209 Schizophrenia, unspecified: Secondary | ICD-10-CM | POA: Diagnosis not present

## 2018-03-08 DIAGNOSIS — G44209 Tension-type headache, unspecified, not intractable: Secondary | ICD-10-CM | POA: Diagnosis not present

## 2018-03-08 DIAGNOSIS — J039 Acute tonsillitis, unspecified: Secondary | ICD-10-CM | POA: Diagnosis not present

## 2018-03-08 DIAGNOSIS — M542 Cervicalgia: Secondary | ICD-10-CM | POA: Diagnosis not present

## 2018-03-11 DIAGNOSIS — M542 Cervicalgia: Secondary | ICD-10-CM | POA: Diagnosis not present

## 2018-03-11 DIAGNOSIS — Z6829 Body mass index (BMI) 29.0-29.9, adult: Secondary | ICD-10-CM | POA: Diagnosis not present

## 2018-03-25 ENCOUNTER — Ambulatory Visit: Payer: Medicare Other | Admitting: Cardiology

## 2018-03-29 ENCOUNTER — Encounter: Payer: Self-pay | Admitting: Cardiology

## 2018-03-29 ENCOUNTER — Ambulatory Visit (INDEPENDENT_AMBULATORY_CARE_PROVIDER_SITE_OTHER): Payer: Medicare Other | Admitting: Cardiology

## 2018-03-29 VITALS — BP 130/60 | HR 64 | Ht <= 58 in | Wt 150.0 lb

## 2018-03-29 DIAGNOSIS — I471 Supraventricular tachycardia: Secondary | ICD-10-CM

## 2018-03-29 DIAGNOSIS — M353 Polymyalgia rheumatica: Secondary | ICD-10-CM | POA: Diagnosis not present

## 2018-03-29 DIAGNOSIS — I110 Hypertensive heart disease with heart failure: Secondary | ICD-10-CM | POA: Diagnosis not present

## 2018-03-29 DIAGNOSIS — H60393 Other infective otitis externa, bilateral: Secondary | ICD-10-CM | POA: Diagnosis not present

## 2018-03-29 DIAGNOSIS — E118 Type 2 diabetes mellitus with unspecified complications: Secondary | ICD-10-CM

## 2018-03-29 NOTE — Progress Notes (Signed)
Cardiology Office Note:    Date:  03/29/2018   ID:  Stacy Atkinson, DOB 16-Jan-1941, MRN [id number]  PCP:  Lowella Dandy, NP  Cardiologist:  Shirlee More, MD    Referring MD: Lowella Dandy, NP    ASSESSMENT:    1. PAT (paroxysmal atrial tachycardia) ([place])   2. Hypertensive heart disease with heart failure ([place])   3. Type 2 diabetes mellitus with complication, without long-term current use of insulin (HCC)    PLAN:    In order of problems listed above:  1. Stable she has had no clinical recurrence and will continue her beta-blocker 2. Able her blood pressure is at target on a multidrug regimen including clonidine alpha-blocker beta-blocker and diuretic along with ARB there is is no edema continue current loop diuretic 3. Stable as managed by her PCP 4. Can shoulder pain suggestive of polymyalgia rheumatica I gave her handwritten note discussed with her PCP when seen today   Next appointment: 6 months   Medication Adjustments/Labs and Tests Ordered: Current medicines are reviewed at length with the patient today.  Concerns regarding medicines are outlined above.  No orders of the defined types were placed in this encounter.  No orders of the defined types were placed in this encounter.   Chief Complaint  Patient presents with  . Follow-up    History of Present Illness:    Stacy Atkinson is a 77 y.o. female with a hx of PAT, hypertension and edema due to a CCB last seen 11/25/17.She is improved off a CCB. Compliance with diet, lifestyle and medications: Yes  Her weight at home is stable blood pressures been at target no recurrent tachycardia and her legs are improved proved off calcium channel blocker.  Fortunately she complains bitterly of neck and bilateral shoulder pain and weakness and interestingly took steroids for 5 days that markedly improved her but now she is back to her previous.  Note to discuss with Dr. Elissa Hefty whether she has polymyalgia rheumatica. Past Medical  History:  Diagnosis Date  . Anemia 08/06/2017  . Anxiety 08/06/2017  . Arthritis 08/06/2017   Neck, shoulders, and hip  . Depression 08/06/2017  . Dyslipidemia 05/18/2017  . Essential hypertension 05/18/2017  . GERD (gastroesophageal reflux disease) 08/06/2017  . Holter monitor, abnormal 05/18/2017  . PAT (paroxysmal atrial tachycardia) ([place]) 05/18/2017  . Syncope 05/18/2017  . Type 2 diabetes mellitus ([place]) 08/06/2017    Past Surgical History:  Procedure Laterality Date  . ABDOMINAL HYSTERECTOMY    . bladder tack    . CHOLECYSTECTOMY    . COLONOSCOPY    . CORNEAL TRANSPLANT Bilateral     Current Medications: Current Meds  Medication Sig  . acetaminophen (TYLENOL) 650 MG CR tablet Take 650 mg by mouth every 8 (eight) hours as needed for pain.  Marland Kitchen alendronate (FOSAMAX) 70 MG tablet Take 70 mg by mouth once a week.  Marland Kitchen aspirin EC 81 MG tablet Take 1 tablet by mouth daily.  . Calcium-Magnesium 500-250 MG TABS Take 1 tablet by mouth daily.  . Cholecalciferol (VITAMIN D) 2000 units CAPS Take 1 capsule by mouth daily.  . cloNIDine (CATAPRES) 0.2 MG tablet Take 0.2 mg by mouth 3 (three) times daily.  . DOCOSAHEXAENOIC ACID PO Take 1 tablet by mouth daily.  Marland Kitchen doxazosin (CARDURA) 2 MG tablet Take 2 mg by mouth daily. Take if BP is over 150 in the evening  . fenofibrate (TRICOR) 145 MG tablet Take 145 mg by mouth daily.  Marland Kitchen  fluticasone (FLONASE) 50 MCG/ACT nasal spray Place 2 sprays into both nostrils daily as needed for allergies or rhinitis.  . furosemide (LASIX) 20 MG tablet Take 20 mg by mouth daily.   . hydrALAZINE (APRESOLINE) 100 MG tablet Take 100 mg by mouth 3 (three) times daily.  . isosorbide mononitrate (IMDUR) 30 MG 24 hr tablet Take 30 mg by mouth daily.  . lansoprazole (PREVACID) 30 MG capsule Take 30 mg by mouth 2 (two) times daily.  Marland Kitchen LORazepam (ATIVAN) 1 MG tablet Take 1 mg by mouth 2 (two) times daily.  . metFORMIN (GLUCOPHAGE) 500 MG tablet Take 500 mg by mouth daily.  .  montelukast (SINGULAIR) 10 MG tablet Take 10 mg by mouth daily.  . Multiple Vitamins-Minerals (VISION FORMULA PO) Take 1 tablet by mouth daily.  . nebivolol (BYSTOLIC) 5 MG tablet Take 5 mg by mouth daily.  . potassium chloride SA (K-DUR,KLOR-CON) 20 MEQ tablet Take 20 mEq by mouth daily.  . prednisoLONE acetate (PRED FORTE) 1 % ophthalmic suspension INSTILL 1 DROP IN BOTH EYES ONCE DAILY  . QUEtiapine (SEROQUEL) 25 MG tablet Take 25 mg by mouth daily.   . rosuvastatin (CRESTOR) 10 MG tablet Take 5 mg by mouth daily.   Marland Kitchen telmisartan (MICARDIS) 80 MG tablet Take 80 mg by mouth daily.  . traMADol-acetaminophen (ULTRACET) 37.5-325 MG tablet Take 1 tablet by mouth every 6 (six) hours as needed.  . Zinc 50 MG TABS Take 1 tablet by mouth daily.  . [DISCONTINUED] ondansetron (ZOFRAN-ODT) 4 MG disintegrating tablet Take 4 mg by mouth every 6 (six) hours as needed for nausea/vomiting.  . [DISCONTINUED] telmisartan (MICARDIS) 80 MG tablet Take 80 mg by mouth daily.  . [DISCONTINUED] traMADol (ULTRAM) 50 MG tablet Take 50 mg by mouth every 4 (four) hours as needed.     Allergies:   Latex; Penicillins; and Tape   Social History   Socioeconomic History  . Marital status: Divorced    Spouse name: Not on file  . Number of children: Not on file  . Years of education: Not on file  . Highest education level: Not on file  Occupational History  . Not on file  Social Needs  . Financial resource strain: Not on file  . Food insecurity:    Worry: Not on file    Inability: Not on file  . Transportation needs:    Medical: Not on file    Non-medical: Not on file  Tobacco Use  . Smoking status: Never Smoker  . Smokeless tobacco: Never Used  Substance and Sexual Activity  . Alcohol use: No    Frequency: Never  . Drug use: No  . Sexual activity: Not on file  Lifestyle  . Physical activity:    Days per week: Not on file    Minutes per session: Not on file  . Stress: Not on file  Relationships  .  Social connections:    Talks on phone: Not on file    Gets together: Not on file    Attends religious service: Not on file    Active member of club or organization: Not on file    Attends meetings of clubs or organizations: Not on file    Relationship status: Not on file  Other Topics Concern  . Not on file  Social History Narrative  . Not on file     Family History: The patient's family history includes Breast cancer in her other; Heart attack in her father and mother; Hypertension in  her maternal aunt. ROS:   Please see the history of present illness.    All other systems reviewed and are negative.  EKGs/Labs/Other Studies Reviewed:    The following studies were reviewed today:    Recent Labs: Cholesterol 153 HDL 51 LDL 85 A1c 5.8 hemoglobin 11.5 creatinine 1.08 potassium 4.7   Physical Exam:    VS:  BP 130/60 (BP Location: Right Arm, Patient Position: Sitting, Cuff Size: Normal)   Pulse 64   Ht 4\' 10"  (1.473 m)   Wt 150 lb (68 kg)   SpO2 97%   BMI 31.35 kg/m     Wt Readings from Last 3 Encounters:  03/29/18 150 lb (68 kg)  11/25/17 145 lb 1.9 oz (65.8 kg)  10/13/17 142 lb (64.4 kg)     GEN:  Well nourished, well developed in no acute distress HEENT: Normal NECK: No JVD; No carotid bruits LYMPHATICS: No lymphadenopathy CARDIAC: RRR, no murmurs, rubs, gallops RESPIRATORY:  Clear to auscultation without rales, wheezing or rhonchi  ABDOMEN: Soft, non-tender, non-distended MUSCULOSKELETAL:  No edema; No deformity  SKIN: Warm and dry NEUROLOGIC:  Alert and oriented x 3 PSYCHIATRIC:  Normal affect    Signed, Shirlee More, MD  03/29/2018 10:34 AM    [place]

## 2018-03-29 NOTE — Patient Instructions (Signed)

## 2018-03-30 ENCOUNTER — Telehealth: Payer: Self-pay | Admitting: Cardiology

## 2018-03-30 NOTE — Telephone Encounter (Signed)
Has questions about AVS print out from yesterday

## 2018-03-31 NOTE — Telephone Encounter (Signed)
Patients questions regarding her medications addressed and patient verbalized understanding.

## 2018-04-13 DIAGNOSIS — R7309 Other abnormal glucose: Secondary | ICD-10-CM | POA: Diagnosis not present

## 2018-04-13 DIAGNOSIS — Z1339 Encounter for screening examination for other mental health and behavioral disorders: Secondary | ICD-10-CM | POA: Diagnosis not present

## 2018-04-13 DIAGNOSIS — E785 Hyperlipidemia, unspecified: Secondary | ICD-10-CM | POA: Diagnosis not present

## 2018-04-13 DIAGNOSIS — Z23 Encounter for immunization: Secondary | ICD-10-CM | POA: Diagnosis not present

## 2018-04-13 DIAGNOSIS — D539 Nutritional anemia, unspecified: Secondary | ICD-10-CM | POA: Diagnosis not present

## 2018-04-13 DIAGNOSIS — E871 Hypo-osmolality and hyponatremia: Secondary | ICD-10-CM | POA: Diagnosis not present

## 2018-04-13 DIAGNOSIS — I10 Essential (primary) hypertension: Secondary | ICD-10-CM | POA: Diagnosis not present

## 2018-04-14 DIAGNOSIS — R0602 Shortness of breath: Secondary | ICD-10-CM | POA: Diagnosis not present

## 2018-04-14 DIAGNOSIS — E860 Dehydration: Secondary | ICD-10-CM | POA: Diagnosis not present

## 2018-04-14 DIAGNOSIS — I10 Essential (primary) hypertension: Secondary | ICD-10-CM | POA: Diagnosis not present

## 2018-04-14 DIAGNOSIS — Z79899 Other long term (current) drug therapy: Secondary | ICD-10-CM | POA: Diagnosis not present

## 2018-04-14 DIAGNOSIS — J449 Chronic obstructive pulmonary disease, unspecified: Secondary | ICD-10-CM | POA: Diagnosis not present

## 2018-04-14 DIAGNOSIS — Z7984 Long term (current) use of oral hypoglycemic drugs: Secondary | ICD-10-CM | POA: Diagnosis not present

## 2018-04-14 DIAGNOSIS — Z7982 Long term (current) use of aspirin: Secondary | ICD-10-CM | POA: Diagnosis not present

## 2018-04-14 DIAGNOSIS — R42 Dizziness and giddiness: Secondary | ICD-10-CM | POA: Diagnosis not present

## 2018-04-28 DIAGNOSIS — R293 Abnormal posture: Secondary | ICD-10-CM | POA: Diagnosis not present

## 2018-04-28 DIAGNOSIS — M2560 Stiffness of unspecified joint, not elsewhere classified: Secondary | ICD-10-CM | POA: Diagnosis not present

## 2018-04-28 DIAGNOSIS — M542 Cervicalgia: Secondary | ICD-10-CM | POA: Diagnosis not present

## 2018-05-05 DIAGNOSIS — F209 Schizophrenia, unspecified: Secondary | ICD-10-CM | POA: Diagnosis not present

## 2018-05-06 DIAGNOSIS — M542 Cervicalgia: Secondary | ICD-10-CM | POA: Diagnosis not present

## 2018-05-06 DIAGNOSIS — M2560 Stiffness of unspecified joint, not elsewhere classified: Secondary | ICD-10-CM | POA: Diagnosis not present

## 2018-05-06 DIAGNOSIS — R293 Abnormal posture: Secondary | ICD-10-CM | POA: Diagnosis not present

## 2018-05-12 DIAGNOSIS — N3000 Acute cystitis without hematuria: Secondary | ICD-10-CM | POA: Diagnosis not present

## 2018-05-12 DIAGNOSIS — M5412 Radiculopathy, cervical region: Secondary | ICD-10-CM | POA: Diagnosis not present

## 2018-05-12 DIAGNOSIS — R55 Syncope and collapse: Secondary | ICD-10-CM | POA: Diagnosis not present

## 2018-05-12 DIAGNOSIS — B9689 Other specified bacterial agents as the cause of diseases classified elsewhere: Secondary | ICD-10-CM | POA: Diagnosis not present

## 2018-05-12 DIAGNOSIS — N390 Urinary tract infection, site not specified: Secondary | ICD-10-CM | POA: Diagnosis not present

## 2018-05-12 DIAGNOSIS — M542 Cervicalgia: Secondary | ICD-10-CM | POA: Diagnosis not present

## 2018-05-13 DIAGNOSIS — M542 Cervicalgia: Secondary | ICD-10-CM | POA: Diagnosis not present

## 2018-05-13 DIAGNOSIS — I951 Orthostatic hypotension: Secondary | ICD-10-CM | POA: Diagnosis not present

## 2018-05-13 DIAGNOSIS — D539 Nutritional anemia, unspecified: Secondary | ICD-10-CM | POA: Diagnosis not present

## 2018-05-13 DIAGNOSIS — E871 Hypo-osmolality and hyponatremia: Secondary | ICD-10-CM | POA: Diagnosis not present

## 2018-05-20 DIAGNOSIS — Z6830 Body mass index (BMI) 30.0-30.9, adult: Secondary | ICD-10-CM | POA: Diagnosis not present

## 2018-05-20 DIAGNOSIS — R05 Cough: Secondary | ICD-10-CM | POA: Diagnosis not present

## 2018-05-20 DIAGNOSIS — J208 Acute bronchitis due to other specified organisms: Secondary | ICD-10-CM | POA: Diagnosis not present

## 2018-06-03 DIAGNOSIS — I5032 Chronic diastolic (congestive) heart failure: Secondary | ICD-10-CM | POA: Diagnosis not present

## 2018-06-03 DIAGNOSIS — I10 Essential (primary) hypertension: Secondary | ICD-10-CM | POA: Diagnosis not present

## 2018-06-03 DIAGNOSIS — Z6830 Body mass index (BMI) 30.0-30.9, adult: Secondary | ICD-10-CM | POA: Diagnosis not present

## 2018-07-20 DIAGNOSIS — E785 Hyperlipidemia, unspecified: Secondary | ICD-10-CM | POA: Diagnosis not present

## 2018-07-20 DIAGNOSIS — E871 Hypo-osmolality and hyponatremia: Secondary | ICD-10-CM | POA: Diagnosis not present

## 2018-07-20 DIAGNOSIS — D539 Nutritional anemia, unspecified: Secondary | ICD-10-CM | POA: Diagnosis not present

## 2018-07-20 DIAGNOSIS — R7309 Other abnormal glucose: Secondary | ICD-10-CM | POA: Diagnosis not present

## 2018-07-20 DIAGNOSIS — I10 Essential (primary) hypertension: Secondary | ICD-10-CM | POA: Diagnosis not present

## 2018-08-03 DIAGNOSIS — F209 Schizophrenia, unspecified: Secondary | ICD-10-CM | POA: Diagnosis not present

## 2018-08-08 DIAGNOSIS — H6691 Otitis media, unspecified, right ear: Secondary | ICD-10-CM | POA: Diagnosis not present

## 2018-08-08 DIAGNOSIS — I10 Essential (primary) hypertension: Secondary | ICD-10-CM | POA: Diagnosis not present

## 2018-08-17 DIAGNOSIS — R0981 Nasal congestion: Secondary | ICD-10-CM | POA: Diagnosis not present

## 2018-08-17 DIAGNOSIS — H6982 Other specified disorders of Eustachian tube, left ear: Secondary | ICD-10-CM | POA: Diagnosis not present

## 2018-08-17 DIAGNOSIS — J342 Deviated nasal septum: Secondary | ICD-10-CM | POA: Diagnosis not present

## 2018-08-17 DIAGNOSIS — J343 Hypertrophy of nasal turbinates: Secondary | ICD-10-CM | POA: Diagnosis not present

## 2018-08-17 DIAGNOSIS — H9209 Otalgia, unspecified ear: Secondary | ICD-10-CM | POA: Diagnosis not present

## 2018-08-17 DIAGNOSIS — M26629 Arthralgia of temporomandibular joint, unspecified side: Secondary | ICD-10-CM | POA: Diagnosis not present

## 2018-09-07 DIAGNOSIS — Z6830 Body mass index (BMI) 30.0-30.9, adult: Secondary | ICD-10-CM | POA: Diagnosis not present

## 2018-09-07 DIAGNOSIS — F209 Schizophrenia, unspecified: Secondary | ICD-10-CM | POA: Diagnosis not present

## 2018-09-07 DIAGNOSIS — R208 Other disturbances of skin sensation: Secondary | ICD-10-CM | POA: Diagnosis not present

## 2018-09-07 DIAGNOSIS — I10 Essential (primary) hypertension: Secondary | ICD-10-CM | POA: Diagnosis not present

## 2018-09-16 DIAGNOSIS — Z6830 Body mass index (BMI) 30.0-30.9, adult: Secondary | ICD-10-CM | POA: Diagnosis not present

## 2018-09-16 DIAGNOSIS — J208 Acute bronchitis due to other specified organisms: Secondary | ICD-10-CM | POA: Diagnosis not present

## 2018-09-16 DIAGNOSIS — J309 Allergic rhinitis, unspecified: Secondary | ICD-10-CM | POA: Diagnosis not present

## 2018-09-24 ENCOUNTER — Telehealth: Payer: Self-pay | Admitting: Cardiology

## 2018-09-24 NOTE — Telephone Encounter (Signed)
° °  Cardiac Questionnaire:    Since your last visit or hospitalization:    1. Have you been having new or worsening chest pain? no   2. Have you been having new or worsening shortness of breath? no 3. Have you been having new or worsening leg swelling, wt gain, or increase in abdominal girth (pants fitting more tightly)? Patient reports some mild weight gain since being put on prednisone   4. Have you had any passing out spells? no    *A YES to any of these questions would result in the appointment being kept. *If all the answers to these questions are NO, we should indicate that given the current situation regarding the worldwide coronarvirus pandemic, at the recommendation of the CDC, we are looking to limit gatherings in our waiting area, and thus will reschedule their appointment beyond four weeks from today.   _____________   [id number] Pre-Screening Questions:   Do you currently have a fever? no  Have you recently travelled on a cruise, internationally, or to [place], [place], [place], [place], or [place], [place]) ? no  Have you been in contact with someone that is currently pending confirmation of Covid19 testing or has been confirmed to have the [place] virus?  no  Are you currently experiencing fatigue or cough? no

## 2018-09-24 NOTE — Telephone Encounter (Signed)
YOUR CARDIOLOGY TEAM HAS ARRANGED FOR AN E-VISIT FOR YOUR APPOINTMENT - PLEASE REVIEW IMPORTANT INFORMATION BELOW SEVERAL DAYS PRIOR TO YOUR APPOINTMENT  Due to the recent COVID-19 pandemic, we are transitioning in-person office visits to tele-medicine visits in an effort to decrease unnecessary exposure to our patients and staff. Medicare and most insurances are covering these visits without a copay needed. You will need a smartphone if possible. For patients that do not have these items, we can still complete the visit using a telephone but do prefer a smartphone to enable video when possible. You may have a close family member that can help. If possible, we also ask that you have a blood pressure cuff and scale at home to measure your blood pressure, heart rate and weight prior to your scheduled appointment. Patients with clinical needs that need an in-person evaluation and testing will still be able to come to the office if absolutely necessary. If you have any questions, feel free to call our office.     DOWNLOADING [place], go to App Store and type in WebEx in the search bar. [place] [employer], the blue/green circle. The app is free but as with any other app download, your phone may require you to verify saved payment information or Apple password. You do NOT have to create a WebEx account.  - If Android, go to [employer] and type in [employer] in the search bar. [place] [employer], the blue/green circle. The app is free but as with any other app download, your phone may require you to verify saved payment information or Android password. You do NOT have to create a WebEx account.  It is very helpful to have this downloaded before your visit.    2-3 DAYS BEFORE YOUR APPOINTMENT  You will receive a telephone call from one of our [place] team members - your caller ID may say "Unknown caller." If this is a video visit, we will  confirm that you have been able to download the WebEx app. We will remind you check your blood pressure, heart rate and weight prior to your scheduled appointment. If you have an Apple Watch or Kardia, please upload any pertinent ECG strips the day before or morning of your appointment to [place]. Our staff will also make sure you have reviewed the consent and agree to move forward with your scheduled tele-health visit.     THE DAY OF YOUR APPOINTMENT  Approximately 15 minutes prior to your scheduled appointment, you will receive a telephone call from one of [place] team - your caller ID may say "Unknown caller."  Our staff will confirm medications, vital signs for the day and any symptoms you may be experiencing. Please have this information available prior to the time of visit start. It may also be helpful for you to have a pad of paper and pen handy for any instructions given during your visit. They will also walk you through joining the WebEx smartphone meeting if this is a video visit.    CONSENT FOR TELE-HEALTH VISIT - PLEASE RVIEW  I hereby voluntarily request, consent and authorize [facility] and its employed or contracted physicians, physician assistants, nurse practitioners or other licensed health care professionals (the Practitioner), to provide me with telemedicine health care services (the "Services") as deemed necessary by the treating Practitioner. I acknowledge and consent to receive the Services by the Practitioner via telemedicine. I understand that the telemedicine visit will  involve communicating with the Practitioner through live audiovisual communication technology and the disclosure of certain medical information by electronic transmission. I acknowledge that I have been given the opportunity to request an in-person assessment or other available alternative prior to the telemedicine visit and am voluntarily participating in the telemedicine visit.  I understand that I have  the right to withhold or withdraw my consent to the use of telemedicine in the course of my care at any time, without affecting my right to future care or treatment, and that the Practitioner or I may terminate the telemedicine visit at any time. I understand that I have the right to inspect all information obtained and/or recorded in the course of the telemedicine visit and may receive copies of available information for a reasonable fee.  I understand that some of the potential risks of receiving the Services via telemedicine include:  Marland Kitchen Delay or interruption in medical evaluation due to technological equipment failure or disruption; . Information transmitted may not be sufficient (e.g. poor resolution of images) to allow for appropriate medical decision making by the Practitioner; and/or  . In rare instances, security protocols could fail, causing a breach of personal health information.  Furthermore, I acknowledge that it is my responsibility to provide information about my medical history, conditions and care that is complete and accurate to the best of my ability. I acknowledge that Practitioner's advice, recommendations, and/or decision may be based on factors not within their control, such as incomplete or inaccurate data provided by me or distortions of diagnostic images or specimens that may result from electronic transmissions. I understand that the practice of medicine is not an exact science and that Practitioner makes no warranties or guarantees regarding treatment outcomes. I acknowledge that I will receive a copy of this consent concurrently upon execution via email to the email address I last provided but may also request a printed copy by calling the office of [place].    I understand that my insurance will be billed for this visit.   I have read or had this consent read to me. . I understand the contents of this consent, which adequately explains the benefits and risks of the  Services being provided via telemedicine.  . I have been provided ample opportunity to ask questions regarding this consent and the Services and have had my questions answered to my satisfaction. . I give my informed consent for the services to be provided through the use of telemedicine in my medical care  By participating in this telemedicine visit I agree to the above.       Reviewed consent form over the phone with patient who verbally consents to televisit.

## 2018-09-27 ENCOUNTER — Telehealth (INDEPENDENT_AMBULATORY_CARE_PROVIDER_SITE_OTHER): Payer: Medicare Other | Admitting: Cardiology

## 2018-09-27 ENCOUNTER — Encounter: Payer: Self-pay | Admitting: Cardiology

## 2018-09-27 ENCOUNTER — Other Ambulatory Visit: Payer: Self-pay

## 2018-09-27 VITALS — BP 162/70 | HR 62 | Wt 149.0 lb

## 2018-09-27 DIAGNOSIS — I110 Hypertensive heart disease with heart failure: Secondary | ICD-10-CM | POA: Diagnosis not present

## 2018-09-27 DIAGNOSIS — E785 Hyperlipidemia, unspecified: Secondary | ICD-10-CM

## 2018-09-27 DIAGNOSIS — Z79899 Other long term (current) drug therapy: Secondary | ICD-10-CM | POA: Diagnosis not present

## 2018-09-27 DIAGNOSIS — I509 Heart failure, unspecified: Secondary | ICD-10-CM | POA: Diagnosis not present

## 2018-09-27 DIAGNOSIS — R55 Syncope and collapse: Secondary | ICD-10-CM

## 2018-09-27 DIAGNOSIS — I471 Supraventricular tachycardia: Secondary | ICD-10-CM | POA: Diagnosis not present

## 2018-09-27 DIAGNOSIS — R600 Localized edema: Secondary | ICD-10-CM

## 2018-09-27 NOTE — Progress Notes (Signed)
Virtual Visit via Telephone Note  She has no access to Internet or smart phone Evaluation Performed:  Follow-up visit  This visit type was conducted due to national recommendations for restrictions regarding the COVID-19 Pandemic (e.g. social distancing).  This format is felt to be most appropriate for this patient at this time.  All issues noted in this document were discussed and addressed.  No physical exam was performed (except for noted visual exam findings with Video Visits).  Please refer to the patient's chart (MyChart message for video visits and phone note for telephone visits) for the patient's consent to telehealth for [facility].  Date:  09/27/2018   ID:  [name], DOB 1940/12/02, MRN [id number]  Patient Location:  Home  Provider location:   Med center  PCP:  Lowella Dandy, NP  Cardiologist:  No primary care provider on file. Dr Bettina Gavia MD Electrophysiologist:  None   Chief Complaint:  FU for hypertension and PAY  History of Present Illness:    Stacy Atkinson is a 78 y.o. female who presents via audio/video conferencing for a telehealth visit today.    The patient does not symptoms concerning for COVID-19 infection (fever, chills, cough, or new shortness of breath).   Stacy Atkinson is a 77 y.o. female with a hx of hypertension and PAT last seen 11/25/17.  At that time she had hypotension and her alpha-blocker was discontinued. She had an ED visit 05/19/19 with near syncope.   Prior CV studies:   The following studies were reviewed today:  [facility] ED records reviewed 05/12/2018 as noted she had a near syncopal episode her blood pressure in the emergency room was 122/60 and there is no notation that she had orthostatic blood pressures performed CBC showed mild anemia hemoglobin 10.6 CKD creatinine 1.3 GFR 40 cc stage III potassium 4.2.  CT of the head showed chronic microvascular changes not acute CT of the neck showed extensive degenerative disease.  Chest  x-ray showed prominent bronchovascular markings but was not diagnostic of pneumonia or heart failure.  She was simply discharged from the emergency room on antibiotics including Cipro and gabapentin.  She spoke with her PCP and is not taking gabapentin because of edema in the past.  Follow-up labs with her PCP 08/17/2018 show cholesterol 178 LDL 92 triglycerides 242 A1c 6.6 hemoglobin normalized 13.0 creatinine improved 1.0 potassium 4.1 and TSH was normal.  Past Medical History:  Diagnosis Date  . Anemia 08/06/2017  . Anxiety 08/06/2017  . Arthritis 08/06/2017   Neck, shoulders, and hip  . Depression 08/06/2017  . Dyslipidemia 05/18/2017  . Essential hypertension 05/18/2017  . GERD (gastroesophageal reflux disease) 08/06/2017  . Holter monitor, abnormal 05/18/2017  . PAT (paroxysmal atrial tachycardia) ([place]) 05/18/2017  . Syncope 05/18/2017  . Type 2 diabetes mellitus ([place]) 08/06/2017   Past Surgical History:  Procedure Laterality Date  . ABDOMINAL HYSTERECTOMY    . bladder tack    . CHOLECYSTECTOMY    . COLONOSCOPY    . CORNEAL TRANSPLANT Bilateral      No outpatient medications have been marked as taking for the 09/27/18 encounter (Appointment) with Richardo Priest, MD.     Allergies:   Latex; Penicillins; and Tape   Social History   Tobacco Use  . Smoking status: Never Smoker  . Smokeless tobacco: Never Used  Substance Use Topics  . Alcohol use: No    Frequency: Never  . Drug use: No     Family Hx: The patient's  family history includes Breast cancer in an other family member; Heart attack in her father and mother; Hypertension in her maternal aunt.  ROS:   Please see the history of present illness.     All other systems reviewed and are negative.   Labs/Other Tests and Data Reviewed:    Recent Labs: No results found for requested labs within last 8760 hours.   Recent Lipid Panel No results found for: CHOL, TRIG, HDL, CHOLHDL, LDLCALC, LDLDIRECT  Wt Readings from Last  3 Encounters:  03/29/18 150 lb (68 kg)  11/25/17 145 lb 1.9 oz (65.8 kg)  10/13/17 142 lb (64.4 kg)     Exam:    Vital Signs:  There were no vitals taken for this visit.    She self-reports no edema pallor and that her gait is stable  ASSESSMENT & PLAN:    1.  Hypertensive heart disease with heart failure stable takes a minimum dose of loop diuretic 3 days a week tolerates it without recurrent episodes of near syncope sodium restricts and recent labs show stable improved renal function continue her current dose of diuretic as well as multiple antihypertensive agents for resistant hypertension including centrally active clonidine loop diuretic furosemide high-dose hydralazine oral mononitrate isosorbide Imdur diastolic and micardis.  In her case with recurrent episodes of syncope and near syncope I would not try to tighten her hypertensive control.  New York Heart Association class I continue sodium restriction 2.  Near syncope likely orthostatic with multiple antihypertensive agents no longer taking an alpha-blocker and in her case I would except a systolic pressure of less than 160 in the afternoons as a good clinical success.  Tighter blood pressure control places her at risk of trauma and harm 3.  PAT stable no recurrence continue her selective beta-blocker Stable lipids continue fenofibrate and statin Edema lower extremities improved no longer taking gabapentin   COVID-19 Education: The signs and symptoms of COVID-19 were discussed with the patient and how to seek care for testing (follow up with PCP or arrange E-visit).  The importance of social distancing was discussed today.  Patient Risk:   After full review of this patients clinical status, I feel that they are at least moderate risk at this time.  Time:   Today, I have spent 23 minutes with the patient with telehealth technology discussing her resistant hypertension intolerance of [place] with syncope and recent recurrent ED  visit November with near syncope and the modified goal of blood pressure to be typically less than 1 40-1 60 in the afternoons.  She has had no recurrent episodes taking a lower dose of the diuretic her morning blood pressure before medicines runs 160-1 70 and in the afternoons typically runs in the 1 40-1 50 range.  She has no edema presently is not taking gabapentin.  She has had no palpitation recurrent near syncope chest pain shortness of breath.  She finished physical therapy and now does home physical therapy and walking for activity.  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  She has had no fever chill cough wheezing or sputum production.   Medication Adjustments/Labs and Tests Ordered: Current medicines are reviewed at length with the patient today.  Concerns regarding medicines are outlined above.  Tests Ordered: No orders of the defined types were placed in this encounter.  Medication Changes: No orders of the defined types were placed in this encounter.  Disposition:  Follow up 3 months office  Signed, Shirlee More, MD  09/27/2018 10:13 AM    [place]

## 2018-09-27 NOTE — Patient Instructions (Signed)

## 2018-10-05 DIAGNOSIS — N959 Unspecified menopausal and perimenopausal disorder: Secondary | ICD-10-CM | POA: Diagnosis not present

## 2018-10-05 DIAGNOSIS — E785 Hyperlipidemia, unspecified: Secondary | ICD-10-CM | POA: Diagnosis not present

## 2018-10-05 DIAGNOSIS — Z136 Encounter for screening for cardiovascular disorders: Secondary | ICD-10-CM | POA: Diagnosis not present

## 2018-10-05 DIAGNOSIS — Z9181 History of falling: Secondary | ICD-10-CM | POA: Diagnosis not present

## 2018-10-05 DIAGNOSIS — Z0000 Encounter for general adult medical examination without abnormal findings: Secondary | ICD-10-CM | POA: Diagnosis not present

## 2018-10-05 DIAGNOSIS — Z1331 Encounter for screening for depression: Secondary | ICD-10-CM | POA: Diagnosis not present

## 2018-10-05 DIAGNOSIS — Z1231 Encounter for screening mammogram for malignant neoplasm of breast: Secondary | ICD-10-CM | POA: Diagnosis not present

## 2018-10-25 DIAGNOSIS — I10 Essential (primary) hypertension: Secondary | ICD-10-CM | POA: Diagnosis not present

## 2018-10-25 DIAGNOSIS — R7309 Other abnormal glucose: Secondary | ICD-10-CM | POA: Diagnosis not present

## 2018-10-25 DIAGNOSIS — E785 Hyperlipidemia, unspecified: Secondary | ICD-10-CM | POA: Diagnosis not present

## 2018-10-25 DIAGNOSIS — D539 Nutritional anemia, unspecified: Secondary | ICD-10-CM | POA: Diagnosis not present

## 2018-12-21 NOTE — Progress Notes (Signed)
Virtual Visit via Telephone Note   This visit type was conducted due to national recommendations for restrictions regarding the COVID-19 Pandemic (e.g. social distancing) in an effort to limit this patient's exposure and mitigate transmission in our community.  Due to her co-morbid illnesses, this patient is at least at moderate risk for complications without adequate follow up.  This format is felt to be most appropriate for this patient at this time.  The patient did not have access to video technology/had technical difficulties with video requiring transitioning to audio format only (telephone).  All issues noted in this document were discussed and addressed.  No physical exam could be performed with this format.  Please refer to the patient's chart for her  consent to telehealth for [facility].   Date:  12/22/2018   ID:  Stacy Atkinson, DOB [birth year], MRN [id number]  Patient Location: Home Provider Location: Office  PCP:  Lowella Dandy, NP  Cardiologist:  Dr Bettina Gavia Electrophysiologist:  None   Evaluation Performed:  Follow-Up Visit  Chief Complaint:  PAT  History of Present Illness:    Stacy Atkinson is a 78 y.o. female with a hx of hypertension and PAT last seen 08/31/2018  The patient does not have symptoms concerning for COVID-19 infection (fever, chills, cough, or new shortness of breath).   She has good healthcare literacy practices social distance wears a mask and good handwashing technique she really has isolated herself during COVID-19.  She has had no edema shortness of breath orthopnea chest pain palpitation or syncope.  Early a.m. blood pressure often is greater than 081 systolic BP after medication sitting and resting 1 44-8 40 systolic.  I explained to her the second numbers along with use for clinical guidance and I think her blood pressure is well controlled on her current multidrug regimen.  Her last labs were 07/20/2018 cholesterol 157 HDL 46 LDL 83 on target with her  statin triglyceride 139 with triglyceride lowering fenofibrate A1c 5.6 she has a history of anemia hemoglobin was good at 11.2 creatinine 1.09 and potassium normal 4.3 taking a loop diuretic for heart failure and hypertension.  Her weight is stable Past Medical History:  Diagnosis Date   Anemia 08/06/2017   Anxiety 08/06/2017   Arthritis 08/06/2017   Neck, shoulders, and hip   Depression 08/06/2017   Dyslipidemia 05/18/2017   Essential hypertension 05/18/2017   GERD (gastroesophageal reflux disease) 08/06/2017   Holter monitor, abnormal 05/18/2017   PAT (paroxysmal atrial tachycardia) ([place]) 05/18/2017   Syncope 05/18/2017   Type 2 diabetes mellitus ([place]) 08/06/2017   Past Surgical History:  Procedure Laterality Date   ABDOMINAL HYSTERECTOMY     bladder tack     CHOLECYSTECTOMY     COLONOSCOPY     CORNEAL TRANSPLANT Bilateral      Current Meds  Medication Sig   alendronate (FOSAMAX) 70 MG tablet Take 70 mg by mouth once a week.   aspirin EC 81 MG tablet Take 1 tablet by mouth daily.   Cholecalciferol (VITAMIN D) 2000 units CAPS Take 1 capsule by mouth daily.   cloNIDine (CATAPRES) 0.2 MG tablet Take 0.2 mg by mouth 3 (three) times daily.   DOCOSAHEXAENOIC ACID PO Take 1 tablet by mouth daily.   fenofibrate (TRICOR) 145 MG tablet Take 145 mg by mouth daily.   fluticasone (FLONASE) 50 MCG/ACT nasal spray Place 2 sprays into both nostrils daily.    furosemide (LASIX) 20 MG tablet Take 20 mg by mouth 3 (three) times  a week.    hydrALAZINE (APRESOLINE) 100 MG tablet Take 100 mg by mouth 3 (three) times daily.   ibuprofen (ADVIL,MOTRIN) 200 MG tablet Take 200 mg by mouth every 6 (six) hours as needed.   isosorbide mononitrate (IMDUR) 30 MG 24 hr tablet Take 30 mg by mouth daily.   lansoprazole (PREVACID) 30 MG capsule Take 30 mg by mouth 2 (two) times daily.   LORazepam (ATIVAN) 1 MG tablet Take 1 mg by mouth 2 (two) times daily.   metFORMIN (GLUCOPHAGE) 500 MG  tablet Take 500 mg by mouth daily.   montelukast (SINGULAIR) 10 MG tablet Take 10 mg by mouth daily.   Multiple Vitamins-Minerals (VISION FORMULA PO) Take 1 tablet by mouth daily.   nebivolol (BYSTOLIC) 5 MG tablet Take 5 mg by mouth daily.   potassium chloride SA (K-DUR,KLOR-CON) 20 MEQ tablet Take 20 mEq by mouth daily.   prednisoLONE acetate (PRED FORTE) 1 % ophthalmic suspension INSTILL 1 DROP IN BOTH EYES ONCE DAILY   QUEtiapine (SEROQUEL) 25 MG tablet Take 25 mg by mouth daily.    rosuvastatin (CRESTOR) 10 MG tablet Take 5 mg by mouth daily.    telmisartan (MICARDIS) 80 MG tablet Take 80 mg by mouth daily.   traMADol-acetaminophen (ULTRACET) 37.5-325 MG tablet Take 1 tablet by mouth every 6 (six) hours as needed.   VENTOLIN HFA 108 (90 Base) MCG/ACT inhaler INHALE 2 PUFFS BY MOUTH EVERY 4 TO 6 HOURS AS NEEDED     Allergies:   Latex, Penicillins, and Tape   Social History   Tobacco Use   Smoking status: Never Smoker   Smokeless tobacco: Never Used  Substance Use Topics   Alcohol use: No    Frequency: Never   Drug use: No     Family Hx: The patient's family history includes Breast cancer in an other family member; Heart attack in her father and mother; Hypertension in her maternal aunt.  ROS:   Please see the history of present illness.     All other systems reviewed and are negative.   Prior CV studies:   The following studies were reviewed today:    Labs/Other Tests and Data Reviewed:    EKG:  No ECG reviewed.  Recent Labs: No results found for requested labs within last 8760 hours.   Recent Lipid Panel No results found for: CHOL, TRIG, HDL, CHOLHDL, LDLCALC, LDLDIRECT  Wt Readings from Last 3 Encounters:  12/22/18 144 lb 12.8 oz (65.7 kg)  09/27/18 149 lb (67.6 kg)  03/29/18 150 lb (68 kg)     Objective:    Vital Signs:  BP (!) 153/79    Pulse 62    Ht 4\' 10"  (1.473 m)    Wt 144 lb 12.8 oz (65.7 kg)    BMI 30.26 kg/m    VITAL SIGNS:   reviewed her mood affect thought and cognition are normal AO# no rspiratory distress  ASSESSMENT & PLAN:    1. Hypertensive heart disease with heart failure stable blood pressure target continue current multidrug regimen and loop diuretic she will have labs drawn tomorrow in her PCP office for liver function lipids kidney and potassium I feel comfortable seeing her face-to-face in the office in 6 months with plans to do an EKG at that time. 2. Atrial tachycardia stable no clinical recurrence continue beta-blocker 3. Hyperlipidemia stable continue current high intensity statin fenofibrate  COVID-19 Education: The signs and symptoms of COVID-19 were discussed with the patient and how to seek care  for testing (follow up with PCP or arrange E-visit).  The importance of social distancing was discussed today.  Time:   Today, I have spent 20 minutes with the patient with telehealth technology discussing the above problems.     Medication Adjustments/Labs and Tests Ordered: Current medicines are reviewed at length with the patient today.  Concerns regarding medicines are outlined above.   Tests Ordered: No orders of the defined types were placed in this encounter.   Medication Changes: No orders of the defined types were placed in this encounter.   Follow Up:  In Person in 6 month(s)  Signed, Shirlee More, MD  12/22/2018 10:51 AM    [place]

## 2018-12-22 ENCOUNTER — Encounter: Payer: Self-pay | Admitting: Cardiology

## 2018-12-22 ENCOUNTER — Other Ambulatory Visit: Payer: Self-pay

## 2018-12-22 ENCOUNTER — Telehealth (INDEPENDENT_AMBULATORY_CARE_PROVIDER_SITE_OTHER): Payer: Medicare Other | Admitting: Cardiology

## 2018-12-22 VITALS — BP 153/79 | HR 62 | Ht <= 58 in | Wt 144.8 lb

## 2018-12-22 DIAGNOSIS — I110 Hypertensive heart disease with heart failure: Secondary | ICD-10-CM | POA: Diagnosis not present

## 2018-12-22 DIAGNOSIS — E785 Hyperlipidemia, unspecified: Secondary | ICD-10-CM | POA: Diagnosis not present

## 2018-12-22 DIAGNOSIS — I471 Supraventricular tachycardia: Secondary | ICD-10-CM

## 2018-12-22 NOTE — Patient Instructions (Signed)
Medication Instructions:  Your physician recommends that you continue on your current medications as directed. Please refer to the Current Medication list given to you today.  If you need a refill on your cardiac medications before your next appointment, please call your pharmacy.   Lab work: None If you have labs (blood work) drawn today and your tests are completely normal, you will receive your results only by: Marland Kitchen MyChart Message (if you have MyChart) OR . A paper copy in the mail If you have any lab test that is abnormal or we need to change your treatment, we will call you to review the results.  Testing/Procedures: nOne  Follow-Up: At [facility], you and your health needs are our priority.  As part of our continuing mission to provide you with exceptional heart care, we have created designated Provider Care Teams.  These Care Teams include your primary Cardiologist (physician) and Advanced Practice Providers (APPs -  Physician Assistants and Nurse Practitioners) who all work together to provide you with the care you need, when you need it. You will need a follow up appointment in 6 months.  Any Other Special Instructions Will Be Listed Below (If Applicable).

## 2018-12-24 ENCOUNTER — Telehealth: Payer: Medicare Other | Admitting: Cardiology

## 2019-01-14 DIAGNOSIS — M8588 Other specified disorders of bone density and structure, other site: Secondary | ICD-10-CM | POA: Diagnosis not present

## 2019-01-14 DIAGNOSIS — M85852 Other specified disorders of bone density and structure, left thigh: Secondary | ICD-10-CM | POA: Diagnosis not present

## 2019-01-14 DIAGNOSIS — N959 Unspecified menopausal and perimenopausal disorder: Secondary | ICD-10-CM | POA: Diagnosis not present

## 2019-01-14 DIAGNOSIS — Z1231 Encounter for screening mammogram for malignant neoplasm of breast: Secondary | ICD-10-CM | POA: Diagnosis not present

## 2019-01-25 DIAGNOSIS — E785 Hyperlipidemia, unspecified: Secondary | ICD-10-CM | POA: Diagnosis not present

## 2019-01-25 DIAGNOSIS — I10 Essential (primary) hypertension: Secondary | ICD-10-CM | POA: Diagnosis not present

## 2019-01-25 DIAGNOSIS — D539 Nutritional anemia, unspecified: Secondary | ICD-10-CM | POA: Diagnosis not present

## 2019-01-25 DIAGNOSIS — R7309 Other abnormal glucose: Secondary | ICD-10-CM | POA: Diagnosis not present

## 2019-01-25 DIAGNOSIS — Z139 Encounter for screening, unspecified: Secondary | ICD-10-CM | POA: Diagnosis not present

## 2019-03-08 DIAGNOSIS — F209 Schizophrenia, unspecified: Secondary | ICD-10-CM | POA: Diagnosis not present

## 2019-04-21 DIAGNOSIS — Z23 Encounter for immunization: Secondary | ICD-10-CM | POA: Diagnosis not present

## 2019-05-03 DIAGNOSIS — D539 Nutritional anemia, unspecified: Secondary | ICD-10-CM | POA: Diagnosis not present

## 2019-05-03 DIAGNOSIS — E785 Hyperlipidemia, unspecified: Secondary | ICD-10-CM | POA: Diagnosis not present

## 2019-05-03 DIAGNOSIS — M1990 Unspecified osteoarthritis, unspecified site: Secondary | ICD-10-CM | POA: Diagnosis not present

## 2019-05-03 DIAGNOSIS — R7309 Other abnormal glucose: Secondary | ICD-10-CM | POA: Diagnosis not present

## 2019-05-03 DIAGNOSIS — I10 Essential (primary) hypertension: Secondary | ICD-10-CM | POA: Diagnosis not present

## 2019-06-10 DIAGNOSIS — D539 Nutritional anemia, unspecified: Secondary | ICD-10-CM | POA: Diagnosis not present

## 2019-06-10 DIAGNOSIS — Z6827 Body mass index (BMI) 27.0-27.9, adult: Secondary | ICD-10-CM | POA: Diagnosis not present

## 2019-06-10 DIAGNOSIS — I10 Essential (primary) hypertension: Secondary | ICD-10-CM | POA: Diagnosis not present

## 2019-06-15 ENCOUNTER — Ambulatory Visit: Payer: Medicare Other | Admitting: Cardiology

## 2019-06-29 NOTE — Progress Notes (Signed)
Cardiology Office Note:    Date:  06/30/2019   ID:  Stacy Atkinson, DOB 1941/01/23, MRN [id number]  PCP:  Stacy Dandy, NP  Cardiologist:  Stacy More, MD    Referring MD: Stacy Dandy, NP    ASSESSMENT:    1. PAT (paroxysmal atrial tachycardia) ([place])   2. Hypertensive heart disease with heart failure ([place])    PLAN:    In order of problems listed above:  1. Stable no clinical recurrence continue beta-blocker 2. Poorly controlled longstanding refractory resistant hypertension she is on a complex multidrug regimen and I do not think she will tolerate a higher doses of clonidine.  The choices of whether to add MRA or another vasodilator she has tolerated amlodipine in the past she said it was effective does not know why it was stopped before we will place her back on that and have her call the office 2 weeks in her case a goal blood pressure is 1 [id number] systolic majority of the time.  If ineffective either spironolactone or Inspra would be appropriate.   Next appointment: 6 months   Medication Adjustments/Labs and Tests Ordered: Current medicines are reviewed at length with the patient today.  Concerns regarding medicines are outlined above.  Orders Placed This Encounter  Procedures  . EKG 12-Lead   No orders of the defined types were placed in this encounter.   Chief Complaint  Patient presents with  . Follow-up    For paroxysmal atrial tachycardia and  . Hypertension    History of Present Illness:    Stacy Atkinson is a 78 y.o. female with a hx of hypertension and PAT  last seen 12/22/2018.Marland Kitchen Compliance with diet, lifestyle and medications: Yes Her home blood pressure tends to run elevated especially in the evenings and generally is greater than [id number] systolic.  Repeat blood pressure by me in the office today is 170/70 she takes a complex multidrug regimen high doses but is not on a calcium channel blocker that we will add to her regimen and she will call our office in 2 weeks  with the response.  She is not short of breath chest pain palpitation or syncope knows how to check blood pressure and is compliant with her multidrug medical regimen.  I strongly advised her not to go to the emergency room for blood pressure control if possible.  She has had no recurrent atrial tachycardia. Past Medical History:  Diagnosis Date  . Anemia 08/06/2017  . Anxiety 08/06/2017  . Arthritis 08/06/2017   Neck, shoulders, and hip  . Depression 08/06/2017  . Dyslipidemia 05/18/2017  . Essential hypertension 05/18/2017  . GERD (gastroesophageal reflux disease) 08/06/2017  . Holter monitor, abnormal 05/18/2017  . PAT (paroxysmal atrial tachycardia) ([place]) 05/18/2017  . Syncope 05/18/2017  . Type 2 diabetes mellitus ([place]) 08/06/2017    Past Surgical History:  Procedure Laterality Date  . ABDOMINAL HYSTERECTOMY    . bladder tack    . CHOLECYSTECTOMY    . COLONOSCOPY    . CORNEAL TRANSPLANT Bilateral     Current Medications: Current Meds  Medication Sig  . alendronate (FOSAMAX) 70 MG tablet Take 70 mg by mouth once a week.  Marland Kitchen aspirin EC 81 MG tablet Take 1 tablet by mouth daily.  . Cholecalciferol (VITAMIN D) 2000 units CAPS Take 1 capsule by mouth daily.  . cloNIDine (CATAPRES) 0.2 MG tablet Take 0.2 mg by mouth 3 (three) times daily.  . DOCOSAHEXAENOIC ACID PO Take 1 tablet by mouth  daily.  . fenofibrate (TRICOR) 145 MG tablet Take 145 mg by mouth daily.  . fluticasone (FLONASE) 50 MCG/ACT nasal spray Place 2 sprays into both nostrils daily.   . furosemide (LASIX) 20 MG tablet Take 20 mg by mouth 3 (three) times a week.   . hydrALAZINE (APRESOLINE) 100 MG tablet Take 100 mg by mouth 3 (three) times daily.  Marland Kitchen ibuprofen (ADVIL,MOTRIN) 200 MG tablet Take 200 mg by mouth every 6 (six) hours as needed.  . isosorbide mononitrate (IMDUR) 30 MG 24 hr tablet Take 30 mg by mouth daily.  . lansoprazole (PREVACID) 30 MG capsule Take 30 mg by mouth 2 (two) times daily.  Marland Kitchen LORazepam (ATIVAN) 1 MG  tablet Take 1 mg by mouth 2 (two) times daily.  . metFORMIN (GLUCOPHAGE) 500 MG tablet Take 500 mg by mouth daily.  . montelukast (SINGULAIR) 10 MG tablet Take 10 mg by mouth daily.  . Multiple Vitamins-Minerals (VISION FORMULA PO) Take 1 tablet by mouth daily.  . nebivolol (BYSTOLIC) 5 MG tablet Take 5 mg by mouth daily.  . potassium chloride SA (K-DUR,KLOR-CON) 20 MEQ tablet Take 20 mEq by mouth daily.  . prednisoLONE acetate (PRED FORTE) 1 % ophthalmic suspension INSTILL 1 DROP IN BOTH EYES ONCE DAILY  . QUEtiapine (SEROQUEL) 25 MG tablet Take 25 mg by mouth daily.   . rosuvastatin (CRESTOR) 10 MG tablet Take 5 mg by mouth daily.   Marland Kitchen telmisartan (MICARDIS) 80 MG tablet Take 80 mg by mouth daily.  . traMADol-acetaminophen (ULTRACET) 37.5-325 MG tablet Take 1 tablet by mouth every 6 (six) hours as needed.  . TRAVEL SICKNESS 25 MG CHEW Chew 1 tablet by mouth every 6 (six) hours as needed.  . VENTOLIN HFA 108 (90 Base) MCG/ACT inhaler INHALE 2 PUFFS BY MOUTH EVERY 4 TO 6 HOURS AS NEEDED     Allergies:   Latex, Penicillins, and Tape   Social History   Socioeconomic History  . Marital status: Divorced    Spouse name: Not on file  . Number of children: Not on file  . Years of education: Not on file  . Highest education level: Not on file  Occupational History  . Not on file  Tobacco Use  . Smoking status: Never Smoker  . Smokeless tobacco: Never Used  Substance and Sexual Activity  . Alcohol use: No  . Drug use: No  . Sexual activity: Not on file  Other Topics Concern  . Not on file  Social History Narrative  . Not on file   Social Determinants of Health   Financial Resource Strain:   . Difficulty of Paying Living Expenses: Not on file  Food Insecurity:   . Worried About Charity fundraiser in the Last Year: Not on file  . Ran Out of Food in the Last Year: Not on file  Transportation Needs:   . Lack of Transportation (Medical): Not on file  . Lack of Transportation  (Non-Medical): Not on file  Physical Activity:   . Days of Exercise per Week: Not on file  . Minutes of Exercise per Session: Not on file  Stress:   . Feeling of Stress : Not on file  Social Connections:   . Frequency of Communication with Friends and Family: Not on file  . Frequency of Social Gatherings with Friends and Family: Not on file  . Attends Religious Services: Not on file  . Active Member of Clubs or Organizations: Not on file  . Attends Archivist  Meetings: Not on file  . Marital Status: Not on file     Family History: The patient's family history includes Breast cancer in an other family member; Heart attack in her father and mother; Hypertension in her maternal aunt. ROS:   Please see the history of present illness.    All other systems reviewed and are negative.  EKGs/Labs/Other Studies Reviewed:    The following studies were reviewed today:  EKG:  EKG ordered today and personally reviewed.  The ekg ordered today demonstrates sinus rhythm and is normal  Recent Labs: 05/03/2019: Cholesterol 141 HDL 38 LDL 68 creatinine 0.99  Physical Exam:    VS:  BP (!) 162/80   Pulse 85   Ht 4\' 10"  (1.473 m)   Wt 135 lb (61.2 kg)   SpO2 96%   BMI 28.22 kg/m     Wt Readings from Last 3 Encounters:  06/30/19 135 lb (61.2 kg)  12/22/18 144 lb 12.8 oz (65.7 kg)  09/27/18 149 lb (67.6 kg)     GEN:  Well nourished, well developed in no acute distress HEENT: Normal NECK: No JVD; No carotid bruits LYMPHATICS: No lymphadenopathy CARDIAC: RRR, no murmurs, rubs, gallops RESPIRATORY:  Clear to auscultation without rales, wheezing or rhonchi  ABDOMEN: Soft, non-tender, non-distended MUSCULOSKELETAL:  No edema; No deformity  SKIN: Warm and dry NEUROLOGIC:  Alert and oriented x 3 PSYCHIATRIC:  Normal affect    Signed, Stacy More, MD  06/30/2019 3:33 PM    [facility]

## 2019-06-30 ENCOUNTER — Other Ambulatory Visit: Payer: Self-pay

## 2019-06-30 ENCOUNTER — Encounter: Payer: Self-pay | Admitting: Cardiology

## 2019-06-30 ENCOUNTER — Encounter

## 2019-06-30 ENCOUNTER — Ambulatory Visit (INDEPENDENT_AMBULATORY_CARE_PROVIDER_SITE_OTHER): Payer: Medicare Other | Admitting: Cardiology

## 2019-06-30 VITALS — BP 162/80 | HR 85 | Ht <= 58 in | Wt 135.0 lb

## 2019-06-30 DIAGNOSIS — I110 Hypertensive heart disease with heart failure: Secondary | ICD-10-CM | POA: Diagnosis not present

## 2019-06-30 DIAGNOSIS — I471 Supraventricular tachycardia: Secondary | ICD-10-CM | POA: Diagnosis not present

## 2019-06-30 MED ORDER — AMLODIPINE BESYLATE 5 MG PO TABS: 5.0000 mg | ORAL_TABLET | Freq: Every day | ORAL | 3 refills | Status: AC

## 2019-06-30 NOTE — Patient Instructions (Addendum)
Medication Instructions:  Your physician has recommended you make the following change in your medication:   Start taking amlodipine 5 mg (1 tablet) once daily around 4 PM  CALL office in two weeks with BP logs in response to new   *If you need a refill on your cardiac medications before your next appointment, please call your pharmacy*  Lab Work: NONE If you have labs (blood work) drawn today and your tests are completely normal, you will receive your results only by: Marland Kitchen MyChart Message (if you have MyChart) OR . A paper copy in the mail If you have any lab test that is abnormal or we need to change your treatment, we will call you to review the results.  Testing/Procedures: You had an EKG performed today  Follow-Up: At [facility], you and your health needs are our priority.  As part of our continuing mission to provide you with exceptional heart care, we have created designated Provider Care Teams.  These Care Teams include your primary Cardiologist (physician) and Advanced Practice Providers (APPs -  Physician Assistants and Nurse Practitioners) who all work together to provide you with the care you need, when you need it.  Your next appointment:   6 month(s)  The format for your next appointment:   In Person  Provider:   Shirlee More, MD  Other Instructions  Amlodipine Oral Tablets What is this medicine? AMLODIPINE (am LOE di peen) is a calcium channel blocker. It relaxes your blood vessels and decreases the amount of work the heart has to do. It treats high blood pressure and/or prevents chest pain (also called angina). This medicine may be used for other purposes; ask your health care provider or pharmacist if you have questions. COMMON BRAND NAME(S): Norvasc What should I tell my health care provider before I take this medicine? They need to know if you have any of these conditions:  heart disease  liver disease  an unusual or allergic reaction to amlodipine,  other drugs, foods, dyes, or preservatives  pregnant or trying to get pregnant  breast-feeding How should I use this medicine? Take this drug by mouth. Take it as directed on the prescription label at the same time every day. You can take it with or without food. If it upsets your stomach, take it with food. Keep taking it unless your health care provider tells you to stop. Talk to your health care provider about the use of this drug in children. While it may be prescribed for children as young as 6 for selected conditions, precautions do apply. Overdosage: If you think you have taken too much of this medicine contact a poison control center or emergency room at once. NOTE: This medicine is only for you. Do not share this medicine with others. What if I miss a dose? If you miss a dose, take it as soon as you can. If it is almost time for your next dose, take only that dose. Do not take double or extra doses. What may interact with this medicine? This medicine may interact with the following medications:  clarithromycin  cyclosporine  diltiazem  itraconazole  simvastatin  tacrolimus This list may not describe all possible interactions. Give your health care provider a list of all the medicines, herbs, non-prescription drugs, or dietary supplements you use. Also tell them if you smoke, drink alcohol, or use illegal drugs. Some items may interact with your medicine. What should I watch for while using this medicine? Visit your health care  provider for regular checks on your progress. Check your blood pressure as directed. Ask your health care provider what your blood pressure should be. Also, find out when you should contact him or her. Do not treat yourself for coughs, colds, or pain while you are using this drug without asking your health care provider for advice. Some drugs may increase your blood pressure. You may get drowsy or dizzy. Do not drive, use machinery, or do anything that  needs mental alertness until you know how this drug affects you. Do not stand up or sit up quickly, especially if you are an older patient. This reduces the risk of dizzy or fainting spells. What side effects may I notice from receiving this medicine? Side effects that you should report to your doctor or health care provider as soon as possible:  allergic reactions (skin rash, itching or hives; swelling of the face, lips, or tongue)  heart attack (trouble breathing; pain or tightness in the chest, neck, back or arms; unusually weak or tired)  low blood pressure (dizziness; feeling faint or lightheaded, falls; unusually weak or tired) Side effects that usually do not require medical attention (report these to your doctor or health care provider if they continue or are bothersome):  facial flushing  nausea  palpitations  stomach pain  sudden weight gain  swelling of the ankles, feet, hands This list may not describe all possible side effects. Call your doctor for medical advice about side effects. You may report side effects to FDA at 1-800-FDA-1088. Where should I keep my medicine? Keep out of the reach of children and pets. Store at room temperature between 59 and 86 degrees F (15 and 30 degrees C). Protect from light and moisture. Keep the container tightly closed. Throw away any unused drug after the expiration date. NOTE: This sheet is a summary. It may not cover all possible information. If you have questions about this medicine, talk to your doctor, pharmacist, or health care provider.  2020 Elsevier/Gold Standard (2019-03-22 19:39:45)

## 2019-08-04 DIAGNOSIS — I10 Essential (primary) hypertension: Secondary | ICD-10-CM | POA: Diagnosis not present

## 2019-08-04 DIAGNOSIS — R7309 Other abnormal glucose: Secondary | ICD-10-CM | POA: Diagnosis not present

## 2019-08-04 DIAGNOSIS — D539 Nutritional anemia, unspecified: Secondary | ICD-10-CM | POA: Diagnosis not present

## 2019-08-04 DIAGNOSIS — E785 Hyperlipidemia, unspecified: Secondary | ICD-10-CM | POA: Diagnosis not present

## 2019-09-05 DIAGNOSIS — D539 Nutritional anemia, unspecified: Secondary | ICD-10-CM | POA: Diagnosis not present

## 2019-09-05 DIAGNOSIS — I10 Essential (primary) hypertension: Secondary | ICD-10-CM | POA: Diagnosis not present

## 2019-09-05 DIAGNOSIS — Z6827 Body mass index (BMI) 27.0-27.9, adult: Secondary | ICD-10-CM | POA: Diagnosis not present

## 2019-09-22 DIAGNOSIS — Z6827 Body mass index (BMI) 27.0-27.9, adult: Secondary | ICD-10-CM | POA: Diagnosis not present

## 2019-09-22 DIAGNOSIS — F200 Paranoid schizophrenia: Secondary | ICD-10-CM | POA: Diagnosis not present

## 2019-09-22 DIAGNOSIS — Z79899 Other long term (current) drug therapy: Secondary | ICD-10-CM | POA: Diagnosis not present

## 2019-09-22 DIAGNOSIS — R300 Dysuria: Secondary | ICD-10-CM | POA: Diagnosis not present

## 2019-09-22 DIAGNOSIS — R109 Unspecified abdominal pain: Secondary | ICD-10-CM | POA: Diagnosis not present

## 2019-10-10 DIAGNOSIS — Z9181 History of falling: Secondary | ICD-10-CM | POA: Diagnosis not present

## 2019-10-10 DIAGNOSIS — E785 Hyperlipidemia, unspecified: Secondary | ICD-10-CM | POA: Diagnosis not present

## 2019-10-10 DIAGNOSIS — Z0000 Encounter for general adult medical examination without abnormal findings: Secondary | ICD-10-CM | POA: Diagnosis not present

## 2019-10-10 DIAGNOSIS — Z1331 Encounter for screening for depression: Secondary | ICD-10-CM | POA: Diagnosis not present

## 2019-10-10 DIAGNOSIS — Z1231 Encounter for screening mammogram for malignant neoplasm of breast: Secondary | ICD-10-CM | POA: Diagnosis not present

## 2019-11-03 DIAGNOSIS — R7309 Other abnormal glucose: Secondary | ICD-10-CM | POA: Diagnosis not present

## 2019-11-03 DIAGNOSIS — D539 Nutritional anemia, unspecified: Secondary | ICD-10-CM | POA: Diagnosis not present

## 2019-11-03 DIAGNOSIS — I10 Essential (primary) hypertension: Secondary | ICD-10-CM | POA: Diagnosis not present

## 2019-11-03 DIAGNOSIS — J309 Allergic rhinitis, unspecified: Secondary | ICD-10-CM | POA: Diagnosis not present

## 2019-11-03 DIAGNOSIS — E785 Hyperlipidemia, unspecified: Secondary | ICD-10-CM | POA: Diagnosis not present

## 2019-11-21 DIAGNOSIS — I10 Essential (primary) hypertension: Secondary | ICD-10-CM | POA: Diagnosis not present

## 2019-11-21 DIAGNOSIS — R0989 Other specified symptoms and signs involving the circulatory and respiratory systems: Secondary | ICD-10-CM | POA: Diagnosis not present

## 2019-11-21 DIAGNOSIS — Z6827 Body mass index (BMI) 27.0-27.9, adult: Secondary | ICD-10-CM | POA: Diagnosis not present

## 2019-11-21 DIAGNOSIS — R55 Syncope and collapse: Secondary | ICD-10-CM | POA: Diagnosis not present

## 2019-11-22 DIAGNOSIS — R55 Syncope and collapse: Secondary | ICD-10-CM | POA: Diagnosis not present

## 2019-11-22 DIAGNOSIS — I6501 Occlusion and stenosis of right vertebral artery: Secondary | ICD-10-CM | POA: Diagnosis not present

## 2019-11-22 DIAGNOSIS — I6523 Occlusion and stenosis of bilateral carotid arteries: Secondary | ICD-10-CM | POA: Diagnosis not present

## 2019-11-22 DIAGNOSIS — R0989 Other specified symptoms and signs involving the circulatory and respiratory systems: Secondary | ICD-10-CM | POA: Diagnosis not present

## 2019-11-25 DIAGNOSIS — M542 Cervicalgia: Secondary | ICD-10-CM | POA: Diagnosis not present

## 2019-11-29 DIAGNOSIS — M4802 Spinal stenosis, cervical region: Secondary | ICD-10-CM | POA: Diagnosis not present

## 2019-11-29 DIAGNOSIS — M542 Cervicalgia: Secondary | ICD-10-CM | POA: Diagnosis not present

## 2019-11-29 DIAGNOSIS — R42 Dizziness and giddiness: Secondary | ICD-10-CM | POA: Diagnosis not present

## 2019-11-29 DIAGNOSIS — I10 Essential (primary) hypertension: Secondary | ICD-10-CM | POA: Diagnosis not present

## 2019-12-16 DIAGNOSIS — M542 Cervicalgia: Secondary | ICD-10-CM | POA: Diagnosis not present

## 2019-12-16 DIAGNOSIS — M47812 Spondylosis without myelopathy or radiculopathy, cervical region: Secondary | ICD-10-CM | POA: Diagnosis not present

## 2019-12-16 DIAGNOSIS — M50322 Other cervical disc degeneration at C5-C6 level: Secondary | ICD-10-CM | POA: Diagnosis not present

## 2019-12-16 DIAGNOSIS — M4802 Spinal stenosis, cervical region: Secondary | ICD-10-CM | POA: Diagnosis not present

## 2020-02-03 DIAGNOSIS — R05 Cough: Secondary | ICD-10-CM | POA: Diagnosis not present

## 2020-02-07 DIAGNOSIS — R6889 Other general symptoms and signs: Secondary | ICD-10-CM | POA: Diagnosis not present

## 2020-02-07 DIAGNOSIS — Z20822 Contact with and (suspected) exposure to covid-19: Secondary | ICD-10-CM | POA: Diagnosis not present

## 2020-02-07 DIAGNOSIS — J209 Acute bronchitis, unspecified: Secondary | ICD-10-CM | POA: Diagnosis not present

## 2020-02-09 DIAGNOSIS — I517 Cardiomegaly: Secondary | ICD-10-CM | POA: Diagnosis not present

## 2020-02-09 DIAGNOSIS — I340 Nonrheumatic mitral (valve) insufficiency: Secondary | ICD-10-CM | POA: Diagnosis not present

## 2020-02-09 DIAGNOSIS — J9601 Acute respiratory failure with hypoxia: Secondary | ICD-10-CM | POA: Diagnosis not present

## 2020-02-09 DIAGNOSIS — J189 Pneumonia, unspecified organism: Secondary | ICD-10-CM | POA: Diagnosis not present

## 2020-02-09 DIAGNOSIS — U071 COVID-19: Secondary | ICD-10-CM | POA: Diagnosis not present

## 2020-02-09 DIAGNOSIS — R401 Stupor: Secondary | ICD-10-CM | POA: Diagnosis not present

## 2020-02-09 DIAGNOSIS — J1282 Pneumonia due to coronavirus disease 2019: Secondary | ICD-10-CM | POA: Diagnosis not present

## 2020-02-09 DIAGNOSIS — I272 Pulmonary hypertension, unspecified: Secondary | ICD-10-CM | POA: Diagnosis not present

## 2020-02-29 DEATH — deceased
